# Patient Record
Sex: Male | Born: 1990 | Race: Black or African American | Hispanic: No | Marital: Single | Smoking: Current every day smoker
Health system: Southern US, Community
[De-identification: ages and names within clinical notes are randomized; demographics above are authoritative.]

## PROBLEM LIST (undated history)

## (undated) DIAGNOSIS — J45909 Unspecified asthma, uncomplicated: Secondary | ICD-10-CM

---

## 2016-02-08 ENCOUNTER — Encounter (HOSPITAL_COMMUNITY): Payer: Self-pay

## 2016-02-08 ENCOUNTER — Emergency Department (HOSPITAL_COMMUNITY)
Admission: EM | Admit: 2016-02-08 | Discharge: 2016-02-08 | Disposition: A | Discharge status: Home / Self Care | Payer: Self-pay | Attending: Emergency Medicine | Admitting: Emergency Medicine

## 2016-02-08 DIAGNOSIS — Z720 Tobacco use: Secondary | ICD-10-CM

## 2016-02-08 DIAGNOSIS — F17200 Nicotine dependence, unspecified, uncomplicated: Secondary | ICD-10-CM | POA: Insufficient documentation

## 2016-02-08 DIAGNOSIS — J4521 Mild intermittent asthma with (acute) exacerbation: Secondary | ICD-10-CM | POA: Insufficient documentation

## 2016-02-08 HISTORY — DX: Unspecified asthma, uncomplicated: J45.909

## 2016-02-08 MED ORDER — AEROCHAMBER PLUS FLO-VU MEDIUM MISC
1.0000 | Freq: Once | Status: AC
  Administered 2016-02-08: 1
  Filled 2016-02-08: qty 1

## 2016-02-08 MED ORDER — AEROCHAMBER PLUS W/MASK MISC
Status: AC
  Filled 2016-02-08: qty 1

## 2016-02-08 MED ORDER — PREDNISONE 10 MG (21) PO TBPK: 10.0000 mg | ORAL_TABLET | Freq: Every day | ORAL | 0 refills | Status: DC

## 2016-02-08 MED ORDER — ALBUTEROL SULFATE HFA 108 (90 BASE) MCG/ACT IN AERS
1.0000 | INHALATION_SPRAY | RESPIRATORY_TRACT | Status: DC | PRN
  Administered 2016-02-08: 1 via RESPIRATORY_TRACT
  Filled 2016-02-08: qty 6.7

## 2016-02-08 MED ORDER — ALBUTEROL SULFATE HFA 108 (90 BASE) MCG/ACT IN AERS: 2.0000 | INHALATION_SPRAY | RESPIRATORY_TRACT | 0 refills | Status: DC | PRN

## 2016-02-08 NOTE — ED Triage Notes (Signed)
Pt presents with 3-4 day  H/o shortness of breath, wheezing and productive cough.  Pt reports he is out of his inhaler, reports phlegm is yellow; reports subjective fevers at home.

## 2016-02-08 NOTE — Discharge Instructions (Signed)
Stop smoking

## 2016-02-08 NOTE — ED Notes (Signed)
Pt states he doesn't want a breathing treatment at this time.

## 2016-02-08 NOTE — ED Notes (Signed)
Pt is in stable condition upon d/c and ambulates from ED. 

## 2016-02-08 NOTE — ED Provider Notes (Signed)
[facility]EMER[facility]Y DEPT Provider Note   CSN: [id number] Arrival date & time: 02/08/16  1050     History   Chief Complaint Chief Complaint  Patient presents with  . Shortness of Breath    HPI Raymond [name] is a 25 y.o. male.  Pt presents to the ED with an asthma exacerbation.  He said that he is out of his inhaler.  The pt said that he had congestion and phlegm.  He did not want a cxr.  He does not want a breathing treatment.  He just wants an inhaler.      Past Medical History:  Diagnosis Date  . Asthma     There are no active problems to display for this patient.   History reviewed. No pertinent surgical history.     Home Medications    Prior to Admission medications   Medication Sig Start Date End Date Taking? Authorizing Provider  albuterol (PROVENTIL HFA;VENTOLIN HFA) 108 (90 Base) MCG/ACT inhaler Inhale 2 puffs into the lungs every 4 (four) hours as needed for wheezing or shortness of breath. 02/08/16   Jacalyn LefevreJulie Denecia Brunette, MD  predniSONE (STERAPRED UNI-PAK 21 TAB) 10 MG (21) TBPK tablet Take 1 tablet (10 mg total) by mouth daily. Take 6 tabs by mouth daily  for 2 days, then 5 tabs for 2 days, then 4 tabs for 2 days, then 3 tabs for 2 days, 2 tabs for 2 days, then 1 tab by mouth daily for 2 days 02/08/16   Jacalyn LefevreJulie Vallen Calabrese, MD    Family History History reviewed. No pertinent family history.  Social History Social History  Substance Use Topics  . Smoking status: Current Every Day Smoker    Packs/day: 0.50  . Smokeless tobacco: Never Used  . Alcohol use No     Allergies   Sulfa antibiotics   Review of Systems Review of Systems  Respiratory: Positive for cough, shortness of breath and wheezing.   All other systems reviewed and are negative.    Physical Exam Updated Vital Signs BP 142/81 (BP Location: Left Arm)   Pulse 69   Temp 98.7 F (37.1 C) (Oral)   Resp 18   Ht 5\' 10"  (1.778 m)   Wt 160 lb (72.6 kg)   SpO2 98%   BMI 22.96 kg/m    Physical Exam  Constitutional: He appears well-developed and well-nourished.  HENT:  Head: Normocephalic and atraumatic.  Right Ear: External ear normal.  Left Ear: External ear normal.  Nose: Nose normal.  Mouth/Throat: Oropharynx is clear and moist.  Eyes: Conjunctivae and EOM are normal. Pupils are equal, round, and reactive to light.  Neck: Normal range of motion. Neck supple.  Cardiovascular: Normal rate, regular rhythm, normal heart sounds and intact distal pulses.   Pulmonary/Chest: He has wheezes.  Abdominal: Soft. Bowel sounds are normal.  Musculoskeletal: Normal range of motion.  Neurological: He is alert.  Skin: Skin is warm.  Psychiatric: He has a normal mood and affect. His behavior is normal. Judgment and thought content normal.  Nursing note and vitals reviewed.    ED Treatments / Results  Labs (all labs ordered are listed, but only abnormal results are displayed) Labs Reviewed - No data to display  EKG  EKG Interpretation None       Radiology No results found.  Procedures Procedures (including critical care time)  Medications Ordered in ED Medications  albuterol (PROVENTIL HFA;VENTOLIN HFA) 108 (90 Base) MCG/ACT inhaler 1-2 puff (1 puff Inhalation Given 02/08/16 1223)  aerochamber plus  with mask device (not administered)  AEROCHAMBER PLUS FLO-VU MEDIUM MISC 1 each (1 each Other Given 02/08/16 1223)     Initial Impression / Assessment and Plan / ED Course  I have reviewed the triage vital signs and the nursing notes.  Pertinent labs & imaging results that were available during my care of the patient were reviewed by me and considered in my medical decision making (see chart for details).  Clinical Course    I told pt not to smoke cigarettes.  He does not want a neb and cxr.  Pt given an inhaler and a spacer.  He knows to return if worse.  Final Clinical Impressions(s) / ED Diagnoses   Final diagnoses:  Mild intermittent asthma with  exacerbation  Tobacco abuse    New Prescriptions Discharge Medication List as of 02/08/2016 12:17 PM    START taking these medications   Details  albuterol (PROVENTIL HFA;VENTOLIN HFA) 108 (90 Base) MCG/ACT inhaler Inhale 2 puffs into the lungs every 4 (four) hours as needed for wheezing or shortness of breath., Starting Mon 02/08/2016, Print    predniSONE (STERAPRED UNI-PAK 21 TAB) 10 MG (21) TBPK tablet Take 1 tablet (10 mg total) by mouth daily. Take 6 tabs by mouth daily  for 2 days, then 5 tabs for 2 days, then 4 tabs for 2 days, then 3 tabs for 2 days, 2 tabs for 2 days, then 1 tab by mouth daily for 2 days, Starting Mon 02/08/2016, Print         Jacalyn LefevreJulie Viyaan Champine, MD 02/08/16 1241

## 2016-02-08 NOTE — ED Notes (Signed)
Pt declined to have any imaging.

## 2016-05-12 ENCOUNTER — Encounter (HOSPITAL_COMMUNITY): Payer: Self-pay | Admitting: Emergency Medicine

## 2016-05-12 ENCOUNTER — Emergency Department (HOSPITAL_COMMUNITY)
Admission: EM | Admit: 2016-05-12 | Discharge: 2016-05-12 | Disposition: A | Discharge status: Home / Self Care | Payer: Self-pay | Attending: Emergency Medicine | Admitting: Emergency Medicine

## 2016-05-12 DIAGNOSIS — J45909 Unspecified asthma, uncomplicated: Secondary | ICD-10-CM | POA: Insufficient documentation

## 2016-05-12 DIAGNOSIS — W228XXA Striking against or struck by other objects, initial encounter: Secondary | ICD-10-CM | POA: Insufficient documentation

## 2016-05-12 DIAGNOSIS — F17200 Nicotine dependence, unspecified, uncomplicated: Secondary | ICD-10-CM | POA: Insufficient documentation

## 2016-05-12 DIAGNOSIS — Y929 Unspecified place or not applicable: Secondary | ICD-10-CM | POA: Insufficient documentation

## 2016-05-12 DIAGNOSIS — Y939 Activity, unspecified: Secondary | ICD-10-CM | POA: Insufficient documentation

## 2016-05-12 DIAGNOSIS — S060X0A Concussion without loss of consciousness, initial encounter: Secondary | ICD-10-CM | POA: Insufficient documentation

## 2016-05-12 DIAGNOSIS — Y999 Unspecified external cause status: Secondary | ICD-10-CM | POA: Insufficient documentation

## 2016-05-12 NOTE — ED Triage Notes (Signed)
States bumped his head at work  On Tuesday , denies loc was fine yesterday but today woke up and vomited x 2 and and he has a h/a

## 2016-05-12 NOTE — ED Provider Notes (Signed)
[facility]PT Provider Note   CSN: [id number] Arrival date & time: 05/12/16  1208  By signing my name below, I, Freida Busman, attest that this documentation has been prepared under the direction and in the presence of Derwood Kaplan, MD . Electronically Signed: Freida Busman, Scribe. 05/12/2016. 8:46 PM.  History   Chief Complaint Chief Complaint  Patient presents with  . Head Injury   The history is provided by the patient. No language interpreter was used.   HPI Comments:  Raymond [name] is a 26 y.o. male who presents to the Emergency Department 2 days s/p head injury complaining of a pounding frontal HA that he woke up with this AM. He reports 2 episodes of associated vomiting today; states pain has almost resolved at this time. His pain at time of onset was a 7/10 and is now a 1/10.  Pt states he struck his forehead on a steel bar and had immediate pain at the time but the pain resolved until today. He denies LOC, seizure, dizziness/lightheadedness, and numbness/weakness. No treatements tied PTA.    Past Medical History:  Diagnosis Date  . Asthma     There are no active problems to display for this patient.   History reviewed. No pertinent surgical history.     Home Medications    Prior to Admission medications   Medication Sig Start Date End Date Taking? Authorizing Provider  albuterol (PROVENTIL HFA;VENTOLIN HFA) 108 (90 Base) MCG/ACT inhaler Inhale 2 puffs into the lungs every 4 (four) hours as needed for wheezing or shortness of breath. 02/08/16   Jacalyn Lefevre, MD  predniSONE (STERAPRED UNI-PAK 21 TAB) 10 MG (21) TBPK tablet Take 1 tablet (10 mg total) by mouth daily. Take 6 tabs by mouth daily  for 2 days, then 5 tabs for 2 days, then 4 tabs for 2 days, then 3 tabs for 2 days, 2 tabs for 2 days, then 1 tab by mouth daily for 2 days 02/08/16   Jacalyn Lefevre, MD    Family History No family history on file.  Social History Social History  Substance  Use Topics  . Smoking status: Current Every Day Smoker    Packs/day: 0.50  . Smokeless tobacco: Never Used  . Alcohol use No     Allergies   Sulfa antibiotics   Review of Systems Review of Systems 10 systems reviewed and all are negative for acute change except as noted in the HPI.  Physical Exam Updated Vital Signs BP 118/64 (BP Location: Right Arm)   Pulse (!) 52   Temp 98.6 F (37 C) (Oral)   Resp 16   SpO2 100%   Physical Exam  Constitutional: He is oriented to person, place, and time. He appears well-developed and well-nourished. No distress.  HENT:  Head: Normocephalic.  Eyes: Conjunctivae are normal.  pupils are 2mm equal and reatcive to light  No nystagmus   Neck:  No nuchal rigidity  Cardiovascular: Normal rate and regular rhythm.   Pulmonary/Chest: Effort normal and breath sounds normal. No respiratory distress.  Abdominal: He exhibits no distension.  Musculoskeletal:  Upper extremity sensation and stregth equal   Neurological: He is alert and oriented to person, place, and time. No cranial nerve deficit.  Cerebellar exam is nml  CN 2-12 intact  Skin: Skin is warm and dry.  Psychiatric: He has a normal mood and affect.  Nursing note and vitals reviewed.    ED Treatments / Results  DIAGNOSTIC STUDIES:  Oxygen Saturation is 100% on  RA, normal by my interpretation.    COORDINATION OF CARE:  1:28 PM Discussed treatment plan with pt at bedside and pt agreed to plan.  Labs (all labs ordered are listed, but only abnormal results are displayed) Labs Reviewed - No data to display  EKG  EKG Interpretation None       Radiology No results found.  Procedures Procedures (including critical care time)  Medications Ordered in ED Medications - No data to display   Initial Impression / Assessment and Plan / ED Course  I have reviewed the triage vital signs and the nursing notes.  Pertinent labs & imaging results that were available during my  care of the patient were reviewed by me and considered in my medical decision making (see chart for details).     I personally performed the services described in this documentation, which was scribed in my presence. The recorded information has been reviewed and is accurate.   Pt comes in with headaches. Had blunt trauma 2 days ago, headache resolved for > 36 hours, and returned this AM. Pain is moderate / medium. Pt had emesis. Pt has no seizures, loss of consciousness or new visual complains, weakness, numbness, dizziness or gait instability.  I suspect that he has concussion related headache and not a brain bleed.  Strict ER return precautions have been discussed, and patient is agreeing with the plan and is comfortable with the workup done and the recommendations from the ER.   Final Clinical Impressions(s) / ED Diagnoses   Final diagnoses:  Concussion without loss of consciousness, initial encounter    New Prescriptions Discharge Medication List as of 05/12/2016  1:36 PM         Derwood KaplanAnkit Shanica Castellanos, MD 05/12/16 2048

## 2016-05-12 NOTE — Discharge Instructions (Signed)
Please return to the ER if the headache gets severe and in not improving, you have associated new one sided numbness, tingling, weakness or confusion, seizures, poor balance or poor vision. ° ° ° ° °
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
## 2016-06-17 ENCOUNTER — Inpatient Hospital Stay (HOSPITAL_COMMUNITY)
Admission: AD | Admit: 2016-06-17 | Discharge: 2016-06-21 | DRG: 885 | Disposition: A | Discharge status: Home / Self Care | Payer: Federal, State, Local not specified - Other | Attending: Psychiatry | Admitting: Psychiatry

## 2016-06-17 ENCOUNTER — Emergency Department (HOSPITAL_COMMUNITY)
Admission: EM | Admit: 2016-06-17 | Discharge: 2016-06-17 | Disposition: A | Discharge status: Other Acute Inpatient Hospital | Payer: Self-pay | Attending: Emergency Medicine | Admitting: Emergency Medicine

## 2016-06-17 ENCOUNTER — Emergency Department (HOSPITAL_COMMUNITY): Payer: Self-pay

## 2016-06-17 ENCOUNTER — Encounter (HOSPITAL_COMMUNITY): Payer: Self-pay | Admitting: *Deleted

## 2016-06-17 DIAGNOSIS — F419 Anxiety disorder, unspecified: Secondary | ICD-10-CM | POA: Diagnosis present

## 2016-06-17 DIAGNOSIS — J45909 Unspecified asthma, uncomplicated: Secondary | ICD-10-CM | POA: Diagnosis present

## 2016-06-17 DIAGNOSIS — X780XXA Intentional self-harm by sharp glass, initial encounter: Secondary | ICD-10-CM | POA: Diagnosis present

## 2016-06-17 DIAGNOSIS — Z882 Allergy status to sulfonamides status: Secondary | ICD-10-CM

## 2016-06-17 DIAGNOSIS — Z79899 Other long term (current) drug therapy: Secondary | ICD-10-CM | POA: Diagnosis not present

## 2016-06-17 DIAGNOSIS — F17200 Nicotine dependence, unspecified, uncomplicated: Secondary | ICD-10-CM | POA: Insufficient documentation

## 2016-06-17 DIAGNOSIS — F332 Major depressive disorder, recurrent severe without psychotic features: Principal | ICD-10-CM | POA: Diagnosis present

## 2016-06-17 DIAGNOSIS — S51811A Laceration without foreign body of right forearm, initial encounter: Secondary | ICD-10-CM | POA: Insufficient documentation

## 2016-06-17 DIAGNOSIS — F1290 Cannabis use, unspecified, uncomplicated: Secondary | ICD-10-CM | POA: Diagnosis not present

## 2016-06-17 DIAGNOSIS — S61512A Laceration without foreign body of left wrist, initial encounter: Secondary | ICD-10-CM | POA: Diagnosis present

## 2016-06-17 DIAGNOSIS — Y999 Unspecified external cause status: Secondary | ICD-10-CM | POA: Insufficient documentation

## 2016-06-17 DIAGNOSIS — W540XXA Bitten by dog, initial encounter: Secondary | ICD-10-CM

## 2016-06-17 DIAGNOSIS — F17210 Nicotine dependence, cigarettes, uncomplicated: Secondary | ICD-10-CM | POA: Diagnosis present

## 2016-06-17 DIAGNOSIS — Y929 Unspecified place or not applicable: Secondary | ICD-10-CM | POA: Insufficient documentation

## 2016-06-17 DIAGNOSIS — S0121XA Laceration without foreign body of nose, initial encounter: Secondary | ICD-10-CM | POA: Insufficient documentation

## 2016-06-17 DIAGNOSIS — Z23 Encounter for immunization: Secondary | ICD-10-CM | POA: Insufficient documentation

## 2016-06-17 DIAGNOSIS — Y939 Activity, unspecified: Secondary | ICD-10-CM | POA: Insufficient documentation

## 2016-06-17 DIAGNOSIS — X788XXA Intentional self-harm by other sharp object, initial encounter: Secondary | ICD-10-CM | POA: Insufficient documentation

## 2016-06-17 DIAGNOSIS — Z9189 Other specified personal risk factors, not elsewhere classified: Secondary | ICD-10-CM

## 2016-06-17 LAB — CBC WITH DIFFERENTIAL/PLATELET
BASOS ABS: 0 10*3/uL (ref 0.0–0.1)
BASOS PCT: 0 %
EOS ABS: 0.1 10*3/uL (ref 0.0–0.7)
EOS PCT: 1 %
HCT: 39.7 % (ref 39.0–52.0)
Hemoglobin: 13.7 g/dL (ref 13.0–17.0)
Lymphocytes Relative: 14 %
Lymphs Abs: 2.1 10*3/uL (ref 0.7–4.0)
MCH: 31.6 pg (ref 26.0–34.0)
MCHC: 34.5 g/dL (ref 30.0–36.0)
MCV: 91.7 fL (ref 78.0–100.0)
Monocytes Absolute: 0.6 10*3/uL (ref 0.1–1.0)
Monocytes Relative: 4 %
Neutro Abs: 11.8 10*3/uL — ABNORMAL HIGH (ref 1.7–7.7)
Neutrophils Relative %: 81 %
Platelets: 279 10*3/uL (ref 150–400)
RBC: 4.33 MIL/uL (ref 4.22–5.81)
RDW: 11.6 % (ref 11.5–15.5)
WBC: 14.6 10*3/uL — ABNORMAL HIGH (ref 4.0–10.5)

## 2016-06-17 LAB — RAPID URINE DRUG SCREEN, HOSP PERFORMED
Amphetamines: NOT DETECTED
BARBITURATES: NOT DETECTED
Benzodiazepines: NOT DETECTED
COCAINE: NOT DETECTED
Opiates: NOT DETECTED
TETRAHYDROCANNABINOL: POSITIVE — AB

## 2016-06-17 LAB — COMPREHENSIVE METABOLIC PANEL
ALT: 30 U/L (ref 17–63)
AST: 27 U/L (ref 15–41)
Albumin: 4.6 g/dL (ref 3.5–5.0)
Alkaline Phosphatase: 52 U/L (ref 38–126)
Anion gap: 11 (ref 5–15)
BUN: 13 mg/dL (ref 6–20)
CHLORIDE: 105 mmol/L (ref 101–111)
CO2: 24 mmol/L (ref 22–32)
CREATININE: 1.01 mg/dL (ref 0.61–1.24)
Calcium: 9.6 mg/dL (ref 8.9–10.3)
GFR calc Af Amer: >60 mL/min (ref >60)
GFR calc non Af Amer: >60 mL/min (ref >60)
Glucose, Bld: 85 mg/dL (ref 65–99)
Potassium: 3.4 mmol/L — ABNORMAL LOW (ref 3.5–5.1)
SODIUM: 140 mmol/L (ref 135–145)
Total Bilirubin: 0.7 mg/dL (ref 0.3–1.2)
Total Protein: 7.9 g/dL (ref 6.5–8.1)

## 2016-06-17 LAB — ETHANOL: Alcohol, Ethyl (B): 12 mg/dL — ABNORMAL HIGH (ref <5)

## 2016-06-17 MED ORDER — MAGNESIUM HYDROXIDE 400 MG/5ML PO SUSP: 30.0000 mL | Freq: Every day | ORAL | Status: DC | PRN

## 2016-06-17 MED ORDER — TETANUS-DIPHTH-ACELL PERTUSSIS 5-2.5-18.5 LF-MCG/0.5 IM SUSP
0.5000 mL | Freq: Once | INTRAMUSCULAR | Status: AC
  Administered 2016-06-17: 0.5 mL via INTRAMUSCULAR
  Filled 2016-06-17: qty 0.5

## 2016-06-17 MED ORDER — ENSURE ENLIVE PO LIQD
237.0000 mL | Freq: Two times a day (BID) | ORAL | Status: DC
  Administered 2016-06-18 – 2016-06-21 (×4): 237 mL via ORAL

## 2016-06-17 MED ORDER — POTASSIUM CHLORIDE CRYS ER 20 MEQ PO TBCR
40.0000 meq | EXTENDED_RELEASE_TABLET | Freq: Once | ORAL | Status: AC
  Administered 2016-06-17: 40 meq via ORAL
  Filled 2016-06-17: qty 2

## 2016-06-17 MED ORDER — ACETAMINOPHEN 325 MG PO TABS
650.0000 mg | ORAL_TABLET | Freq: Four times a day (QID) | ORAL | Status: DC | PRN
  Administered 2016-06-18: 650 mg via ORAL
  Filled 2016-06-17: qty 2

## 2016-06-17 MED ORDER — ONDANSETRON HCL 4 MG PO TABS: 4.0000 mg | ORAL_TABLET | Freq: Three times a day (TID) | ORAL | Status: DC | PRN

## 2016-06-17 MED ORDER — ALUM & MAG HYDROXIDE-SIMETH 200-200-20 MG/5ML PO SUSP: 30.0000 mL | ORAL | Status: DC | PRN

## 2016-06-17 MED ORDER — TRAZODONE HCL 50 MG PO TABS
50.0000 mg | ORAL_TABLET | Freq: Every evening | ORAL | Status: DC | PRN
  Administered 2016-06-17 – 2016-06-19 (×5): 50 mg via ORAL
  Filled 2016-06-17 (×10): qty 1

## 2016-06-17 MED ORDER — IBUPROFEN 200 MG PO TABS: 600.0000 mg | ORAL_TABLET | Freq: Three times a day (TID) | ORAL | Status: DC | PRN

## 2016-06-17 MED ORDER — HYDROXYZINE HCL 25 MG PO TABS
25.0000 mg | ORAL_TABLET | Freq: Three times a day (TID) | ORAL | Status: DC | PRN
  Administered 2016-06-17 – 2016-06-20 (×4): 25 mg via ORAL
  Filled 2016-06-17 (×4): qty 1
  Filled 2016-06-17: qty 10

## 2016-06-17 MED ORDER — ACETAMINOPHEN 325 MG PO TABS: 650.0000 mg | ORAL_TABLET | ORAL | Status: DC | PRN

## 2016-06-17 MED ORDER — AMOXICILLIN-POT CLAVULANATE 875-125 MG PO TABS
1.0000 | ORAL_TABLET | Freq: Once | ORAL | Status: AC
  Administered 2016-06-17: 1 via ORAL
  Filled 2016-06-17: qty 1

## 2016-06-17 MED ORDER — AMOXICILLIN-POT CLAVULANATE 875-125 MG PO TABS: 1.0000 | ORAL_TABLET | Freq: Two times a day (BID) | ORAL | Status: DC

## 2016-06-17 MED ORDER — NICOTINE 21 MG/24HR TD PT24
21.0000 mg | MEDICATED_PATCH | Freq: Every day | TRANSDERMAL | Status: DC
  Administered 2016-06-17 – 2016-06-21 (×5): 21 mg via TRANSDERMAL
  Filled 2016-06-17 (×7): qty 1

## 2016-06-17 MED ORDER — LIDOCAINE HCL (PF) 1 % IJ SOLN
5.0000 mL | Freq: Once | INTRAMUSCULAR | Status: AC
  Administered 2016-06-17: 5 mL
  Filled 2016-06-17: qty 30
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
## 2016-06-17 NOTE — ED Notes (Signed)
[employer] on unit to transport [facility] per MD order. Pt signed for personal property and property given to [employer] for transfer. Pt ambulatory off unit.

## 2016-06-17 NOTE — ED Notes (Addendum)
UNABLE TO COLLECT LABS AT THIS TIME PT IN XRAY

## 2016-06-17 NOTE — ED Notes (Signed)
Bed: [facility] Expected date:  Expected time:  Means of arrival:  Comments: 
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
## 2016-06-17 NOTE — ED Notes (Signed)
Pt refused to change into scrubs. 

## 2016-06-17 NOTE — ED Notes (Signed)
Bed: WA26 Expected date:  Expected time:  Means of arrival:  Comments: 
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
## 2016-06-17 NOTE — ED Notes (Addendum)
Patient and belongings wanded by security. Patient changed into purple scrubs and yellow socks. EDP at bedside.
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
## 2016-06-17 NOTE — Tx Team (Addendum)
Initial Treatment Plan 06/17/2016 8:20 PM Raymond FlakeNathaniel [name] ZOX:[id number]    PATIENT STRESSORS: Loss of breakup with girlfriend Traumatic event   PATIENT STRENGTHS: Average or above average intelligence General fund of knowledge Physical Health Work skills   PATIENT IDENTIFIED PROBLEMS: Breakup with girlfriend  "I have never felt like this before, I cannot stop crying"                   DISCHARGE CRITERIA:  Ability to meet basic life and health needs Adequate post-discharge living arrangements Improved stabilization in mood, thinking, and/or behavior Medical problems require only outpatient monitoring Motivation to continue treatment in a less acute level of care Need for constant or close observation no longer present Reduction of life-threatening or endangering symptoms to within safe limits Safe-care adequate arrangements made Verbal commitment to aftercare and medication compliance  PRELIMINARY DISCHARGE PLAN: Outpatient therapy Placement in alternative living arrangements  PATIENT/FAMILY INVOLVEMENT: This treatment plan has been presented to and reviewed with the patient, Raymond [name], and/or family member, .  The patient and family have been given the opportunity to ask questions and make suggestions.  Beatrix ShipperWright, Jan Martin, RN 06/17/2016, 8:20 PM

## 2016-06-18 DIAGNOSIS — F332 Major depressive disorder, recurrent severe without psychotic features: Principal | ICD-10-CM

## 2016-06-18 DIAGNOSIS — Z79899 Other long term (current) drug therapy: Secondary | ICD-10-CM

## 2016-06-18 LAB — TSH: TSH: 0.851 u[IU]/mL (ref 0.350–4.500)

## 2016-06-18 LAB — LIPID PANEL
CHOLESTEROL: 108 mg/dL (ref 0–200)
HDL: 29 mg/dL — AB (ref >40)
LDL CALC: 61 mg/dL (ref 0–99)
Total CHOL/HDL Ratio: 3.7 RATIO
Triglycerides: 92 mg/dL (ref <150)
VLDL: 18 mg/dL (ref 0–40)

## 2016-06-18 MED ORDER — SERTRALINE HCL 25 MG PO TABS
25.0000 mg | ORAL_TABLET | Freq: Every day | ORAL | Status: DC
Start: 2016-06-18 — End: 2016-06-20
  Administered 2016-06-19: 25 mg via ORAL
  Filled 2016-06-18 (×4): qty 1

## 2016-06-18 NOTE — Progress Notes (Signed)
Pt attend group. Pt said his day 1. Pt appears to be very angry. Pt said his goal to get out here. Pt said he needs to continue with his life. Pt said being here will not help him continue will his life.
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
## 2016-06-18 NOTE — [facility] Group Notes (Addendum)
Identifying Needs   Date:  06/18/2016  Time:  1300  Type of Therapy:  Nurse Education  /  Identifying Needs :  The group focuses on teaching patients how to identify their needs and how to identify the skills needed to get them met.  Participation Level:  Good   Participation Quality:  Good  Affect:  Flat   Cognitive:  Intact   Insight:  Intact  Engagement in Group:  Engaged   Modes of Intervention:  Education  Summary of Progress/Problems:  Lauralyn Primes 06/18/2016, 3:26 PM

## 2016-06-18 NOTE — [facility] Group Notes (Addendum)
[facility] Group Notes:  (Nursing/MHT/Case Management/Adjunct)  Date:  06/18/2016  Time:  4:26 PM  Type of Therapy:  Nurse Education  Participation Level:  Active  Participation Quality:  Appropriate  Affect:  Appropriate  Cognitive:  Appropriate  Insight:  Appropriate  Engagement in Group:  Engaged  Modes of Intervention:  Discussion and Education  Summary of Progress/Problems:  Education provided on positive coping skills. Patient was able to participate during group in an insightful and supportive way.    Almira Barenny G Teena Mangus 06/18/2016, 4:26 PM

## 2016-06-18 NOTE — Progress Notes (Signed)
Raymond [name] has adjusted nicely  to being hospitalized. HE completed his daily assessment and on this he wrote he denied experiencing SI today and he rated his depression, hopelessness and anxiety " 2/2/1", respectively.  HE is seen sitting in the dayroom.Marland Kitchen.watching TV and interacting with his peers this afternoon. He approaches this Clinical research associatewriter this evening, introduces his father and mother to this Clinical research associatewriter and asks Clinical research associatewriter to retrieve  his black cell phone in his locker, per his request , and give to his father. This is done and documented on the patient belonging sheet.  This evening he has been seen in the dayroom...coloring and laughing with his peers and says " Im doing my homework...just like you said...". R Safety is in place and poc,cont.

## 2016-06-18 NOTE — Progress Notes (Signed)
Patient ID: Raymond [name], male   DOB: 1990/09/18, 26 y.o.   MRN: [id number]  D: " I need something before I cry". Patient report feeling anxious. Pt minimizes the reason he is here. Pt denies this was a suicide attempt but claimed his dog attacked him.  Pt denies SI/HI/AVH and pain. Cooperative with assessment.    A: Medications administered as prescribed. Support and encouragement provided as needed.   R: Patient remains safe and complaint with medications.

## 2016-06-18 NOTE — [facility] Group Notes (Signed)
Adult Therapy Group Note (Clinical Social Work)  Date:  06/18/2016  Time:  10:00-11:00AM  Group Topic/Focus: Unhealthy vs Healthy Coping Techniques  The focus of this group was to discuss unhealthy coping techniques currently used by patients, as well as healthy coping techniques they think they would like to learn.  The group discussed that they heard a lot of commonalities, especially the feelings of not being understood and of being ashamed.  They were supportive of each other and generated further discussion, were encouraged to continue talking together after group ended.   Additional Comments:  The patient expressed that when he is faced with problems he often using the unhealthy coping techniques of isolating and thinking negative thoughts.  He would like to learn to meditate and return to doing some of his previous hobbies such as riding a long board and playing basketball.  He was very focused during group on how people in our lives tend to let us down because "they don't care," and rather than this being taken in a negative way by the group, in general there was a consensus.  CSW then introduced the idea of assuming everyone wants to harm us because some people have harmed us.  He was able to distinguish between the two.  He also provided support to another patient specifically on his issue of grief.  Participation Level:  Active  Participation Quality:  Attentive and Sharing and Supportive  Affect:  Anxious and Blunted  Cognitive:  Oriented  Insight: Improving  Engagement in Group:  Developing/Improving  Modes of Intervention:  Discussion and Support  Raymond MantleMareida Grossman-Orr, LCSW 06/18/2016   11:01 AM
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
## 2016-06-19 DIAGNOSIS — R45851 Suicidal ideations: Secondary | ICD-10-CM

## 2016-06-19 LAB — HEMOGLOBIN A1C
Hgb A1c MFr Bld: 4.9 % (ref 4.8–5.6)
MEAN PLASMA GLUCOSE: 94 mg/dL

## 2016-06-19 NOTE — [facility] Group Notes (Signed)
[facility] Group Notes:  (Nursing/MHT/Case Management/Adjunct)  Date:  06/19/2016  Time:  10:26 AM  Type of Therapy:  Nurse Education  Participation Level:  Active  Participation Quality:  Appropriate  Affect:  Appropriate  Cognitive:  Appropriate  Insight:  Appropriate  Engagement in Group:  Engaged  Modes of Intervention:  Discussion and Education  Summary of Progress/Problems:  Educational discussion about anxiety and positive coping skills to manage it. Patient was insightful and attentive throughout group.   Almira Barenny G Shantanu Strauch 06/19/2016, 10:26 AM

## 2016-06-19 NOTE — Progress Notes (Signed)
Patient ID: Raymond [name], male   DOB: 05/16/1990, [age] y.o.   MRN: [id number]  D: Patient report decreased feeling of anxiety. Pt reports he is happy his family is supportive and have a place to go back discharge. Pt denies SI/HI/AVH and pain. Cooperative with assessment.    A: Medications administered as prescribed. Support and encouragement provided as needed.   R: Patient remains safe and complaint with medications.

## 2016-06-19 NOTE — [facility] Group Notes (Signed)
[facility] Group Notes:  (Clinical Social Work)   06/19/2016    10:00-11:00AM  Summary of Progress/Problems:   The main focus of today's process group was to   1)  discuss the importance of adding supports  2)  define health supports versus unhealthy supports  3)  identify the patient's current unhealthy supports and plan how to handle them  4)  Identify the patient's current healthy supports and plan what to add.  An emphasis was placed on using counselor, doctor, therapy groups, 12-step groups, and problem-specific support groups to expand supports.    The patient expressed full comprehension of the concepts presented, and agreed that there is a need to add more supports.  The patient stated he has supported his girlfriend, her sister, and sister's son in "every way imaginable" and has also supported his dog.  He stated that the support he needs to succeed in aftercare is a psychiatrist and to become healthier for himself.  Type of Therapy:  Process Group with Motivational Interviewing  Participation Level:  Active  Participation Quality:  Attentive and Sharing  Affect:  Depressed and Flat  Cognitive:  Appropriate  Insight:  Developing/Improving  Engagement in Therapy:  Engaged  Modes of Intervention:   Education, Support and Processing  Ambrose MantleMareida Grossman-Orr, LCSW 06/19/2016    12:31 PM

## 2016-06-19 NOTE — Progress Notes (Signed)
Pt attend group. His day was a 9. Pt said he saw his day for the first time in 5 years but did not want to see his dad in a mental hospital. He need to figure out why he's here. He know he made a mistake that now he wish he can take back. Now he need to figure out how to grow from here and Scientist, physiologicalatty RN help him to understand.

## 2016-06-19 NOTE — Progress Notes (Signed)
NUTRITION ASSESSMENT  Pt identified as at risk on the Malnutrition Screen Tool  INTERVENTION: 1. Supplements: Continue Ensure Enlive po BID, each supplement provides 350 kcal and 20 grams of protein  NUTRITION DIAGNOSIS: Unintentional weight loss related to sub-optimal intake as evidenced by pt report.   Goal: Pt to meet >/= 90% of their estimated nutrition needs.  Monitor:  PO intake  Assessment:  Pt admitted with depression. Per chart review, pt has lost 17 lb since November 2017 (11% wt loss x 4 months, significant for time frame). [facility]. Will continue nutritional supplement order of Ensure.   Height: Ht Readings from Last 1 Encounters:  06/17/16 5\' 10"  (1.778 m)    Weight: Wt Readings from Last 1 Encounters:  06/17/16 143 lb (64.9 kg)    Weight Hx: Wt Readings from Last 10 Encounters:  06/17/16 143 lb (64.9 kg)  02/08/16 160 lb (72.6 kg)    BMI:  Body mass index is 20.52 kg/m. Pt meets criteria for normal based on current BMI.  Estimated Nutritional Needs: Kcal: 25-30 kcal/kg Protein: > 1 gram protein/kg Fluid: 1 ml/kcal  Diet Order: Diet regular Room service appropriate? Yes; Fluid consistency: Thin Pt is also offered choice of unit snacks mid-morning and mid-afternoon.  Pt is eating as desired.   Lab results and medications reviewed.   Tilda FrancoLindsey Mariah Gerstenberger, MS, RD, LDN Pager: [phone] After Hours Pager: [phone]

## 2016-06-19 NOTE — Progress Notes (Signed)
Raymond [name], Raymond [name] Progress Note  06/19/2016 9:07 AM Raymond [name]  MRN:  [id number] Subjective:  Patient reports " I am sad and depressed, but I know I have to get me together before I can help some else."  Objective:Raymond [name] is awake, alert and oriented *3.  Denies suicidal or homicidal ideation during this assessment. Denies auditory or visual hallucination and does not appear to be responding to internal stimuli. Patient is tearful throughout this discussion.  Patient reports he is medication compliant without mediation side effects. Patient reports his depression 8/10. Support, encouragement and reassurance was provided.   Principal Problem: Severe recurrent major depression without psychotic features (HCC) Diagnosis:   Patient Active Problem List   Diagnosis Date Noted  . Severe recurrent major depression without psychotic features (HCC) [F33.2] 06/17/2016   Total Time spent with patient: 30 minutes  Past Psychiatric History:   Past Medical History:  Past Medical History:  Diagnosis Date  . Asthma    History reviewed. No pertinent surgical history. Family History: History reviewed. No pertinent family history. Family Psychiatric  History:  Social History:  History  Alcohol Use No     History  Drug Use  . Types: Marijuana    Social History   Social History  . Marital status: Single    Spouse name: N/A  . Number of children: N/A  . Years of education: N/A   Social History Main Topics  . Smoking status: Current Every Day Smoker    Packs/day: 0.50  . Smokeless tobacco: Never Used  . Alcohol use No  . Drug use: Yes    Types: Marijuana  . Sexual activity: Yes    Birth control/ protection: None   Other Topics Concern  . None   Social History Narrative  . None   Additional Social History:                         Sleep: Fair  Appetite:  Good  Current Medications: Current Facility-Administered Medications  Medication Dose Route Frequency  Provider Last Rate Last Dose  . acetaminophen (TYLENOL) tablet 650 mg  650 mg Oral Q6H PRN Jackelyn Poling, NP   650 mg at 06/18/16 1112  . alum & mag hydroxide-simeth (MAALOX/MYLANTA) 200-200-20 MG/5ML suspension 30 mL  30 mL Oral Q4H PRN Jackelyn Poling, NP      . feeding supplement (ENSURE ENLIVE) (ENSURE ENLIVE) liquid 237 mL  237 mL Oral BID BM Rockey Situ Cobos, [name]   237 mL at 06/18/16 1500  . hydrOXYzine (ATARAX/VISTARIL) tablet 25 mg  25 mg Oral TID PRN Jackelyn Poling, NP   25 mg at 06/18/16 2012  . magnesium hydroxide (MILK OF MAGNESIA) suspension 30 mL  30 mL Oral Daily PRN Jackelyn Poling, NP      . nicotine (NICODERM CQ - dosed in mg/24 hours) patch 21 mg  21 mg Transdermal Daily Rockey Situ Cobos, [name]   21 mg at 06/18/16 0800  . sertraline (ZOLOFT) tablet 25 mg  25 mg Oral Daily Adonis Brook, NP      . traZODone (DESYREL) tablet 50 mg  50 mg Oral QHS,MR X 1 Jackelyn Poling, NP   50 mg at 06/18/16 2125    Lab Results:  Results for orders placed or performed during the hospital encounter of 06/17/16 (from the past 48 hour(s))  TSH     Status: None   Collection Time: 06/18/16  6:08 AM  Result Value Ref  Range   TSH 0.851 0.350 - 4.500 uIU/mL    Comment: Performed by a 3rd Generation assay with a functional sensitivity of <=0.01 uIU/mL. Performed at [facility], [address] [place], [place] [address]   Lipid panel     Status: Abnormal   Collection Time: 06/18/16  6:08 AM  Result Value Ref Range   Cholesterol 108 0 - 200 mg/dL   Triglycerides 92 <478<150 mg/dL   HDL 29 (L) >29>40 mg/dL   Total CHOL/HDL Ratio 3.7 RATIO   VLDL 18 0 - 40 mg/dL   LDL Cholesterol 61 0 - 99 mg/dL    Comment:        Total Cholesterol/HDL:CHD Risk Coronary Heart Disease Risk Table                     Men   Women  1/2 Average Risk   3.4   3.3  Average Risk       5.0   4.4  2 X Average Risk   9.6   7.1  3 X Average Risk  23.4   11.0        Use the calculated Patient Ratio above and the CHD  Risk Table to determine the patient's CHD Risk.        ATP III CLASSIFICATION (LDL):  <100     mg/dL   Optimal  [id number]  mg/dL   Near or Above                    Optimal  130-159  mg/dL   Borderline  [id number]  mg/dL   High  >696>190     mg/dL   Very High Performed at [facility], [address] [place], [place] [address]     Blood Alcohol level:  Lab Results  Component Value Date   ETH 12 (H) 06/17/2016    Metabolic Disorder Labs: No results found for: HGBA1C, MPG No results found for: PROLACTIN Lab Results  Component Value Date   CHOL 108 06/18/2016   TRIG 92 06/18/2016   HDL 29 (L) 06/18/2016   CHOLHDL 3.7 06/18/2016   VLDL 18 06/18/2016   LDLCALC 61 06/18/2016    Physical Findings: AIMS: Facial and Oral Movements Muscles of Facial Expression: None, normal Lips and Perioral Area: None, normal Jaw: None, normal Tongue: None, normal,Extremity Movements Upper (arms, wrists, hands, fingers): None, normal Lower (legs, knees, ankles, toes): None, normal, Trunk Movements Neck, shoulders, hips: None, normal, Overall Severity Severity of abnormal movements (highest score from questions above): None, normal Incapacitation due to abnormal movements: None, normal Patient's awareness of abnormal movements (rate only patient's report): No Awareness, Dental Status Current problems with teeth and/or dentures?: No Does patient usually wear dentures?: No  CIWA:    COWS:     Musculoskeletal: Strength & Muscle Tone: within normal limits Gait & Station: normal Patient leans: N/A  Psychiatric Specialty Exam: Physical Exam  Vitals reviewed. Constitutional: He is oriented to person, place, and time.  Cardiovascular: Normal rate.   Neurological: He is alert and oriented to person, place, and time.  Psychiatric: He has a normal mood and affect. His behavior is normal.    Review of Systems  Psychiatric/Behavioral: Positive for depression and suicidal ideas. The patient is  nervous/anxious.     Blood pressure 123/78, pulse (!) 59, temperature 97.6 F (36.4 C), temperature source Oral, resp. rate 18, height 5\' 10"  (1.778 m), weight 64.9 kg (143 lb).Body  mass index is 20.52 kg/m.  General Appearance: Casual  Eye Contact:  Fair  Speech:  Clear and Coherent  Volume:  Normal  Mood:  Depressed  Affect:  Blunt, Congruent, Depressed and Flat  Thought Process:  Coherent  Orientation:  Full (Time, Place, and Person)  Thought Content:  Hallucinations: None  Suicidal Thoughts:  No  Homicidal Thoughts:  No  Memory:  Immediate;   Fair Recent;   Fair Remote;   Fair  Judgement:  Fair  Insight:  Lacking  Psychomotor Activity:  Restlessness  Concentration:  Concentration: Fair  Recall:  [employer] of Knowledge:  Fair  Language:  Fair  Akathisia:  No  Handed:  Right  AIMS (if indicated):     Assets:  Communication Skills Desire for Improvement Resilience Social Support  ADL's:  Intact  Cognition:  WNL  Sleep:        I agree with current treatment plan on 06/19/2016, Patient seen face-to-face for psychiatric evaluation follow-up, chart reviewed. Reviewed the information documented and agree with the treatment plan.   Treatment Plan Summary: Daily contact with patient to assess and evaluate symptoms and progress in treatment and Medication management   Continue with Zoloft 25 mg  for mood stabilization. Continue with Trazodone 50 mg for insomnia Will continue to monitor vitals ,medication compliance and treatment side effects while patient is here.  CSW will start working on disposition.  Patient to participate in therapeutic milieu   Oneta Rack, NP 06/19/2016, 9:07 AM  Reviewed notes and agree with plan.

## 2016-06-20 DIAGNOSIS — F1290 Cannabis use, unspecified, uncomplicated: Secondary | ICD-10-CM

## 2016-06-20 DIAGNOSIS — F17210 Nicotine dependence, cigarettes, uncomplicated: Secondary | ICD-10-CM

## 2016-06-20 LAB — PROLACTIN: Prolactin: 15.3 ng/mL — ABNORMAL HIGH (ref 4.0–15.2)

## 2016-06-20 MED ORDER — AMOXICILLIN-POT CLAVULANATE 875-125 MG PO TABS
1.0000 | ORAL_TABLET | Freq: Two times a day (BID) | ORAL | Status: DC
  Administered 2016-06-20 – 2016-06-21 (×2): 1 via ORAL
  Filled 2016-06-20 (×7): qty 1

## 2016-06-20 MED ORDER — AMOXICILLIN-POT CLAVULANATE 875-125 MG PO TABS: 1.0000 | ORAL_TABLET | Freq: Two times a day (BID) | ORAL | Status: DC

## 2016-06-20 MED ORDER — MIRTAZAPINE 7.5 MG PO TABS
7.5000 mg | ORAL_TABLET | Freq: Every day | ORAL | Status: DC
  Administered 2016-06-20: 7.5 mg via ORAL
  Filled 2016-06-20: qty 7
  Filled 2016-06-20 (×2): qty 1
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
## 2016-06-20 NOTE — Progress Notes (Signed)
Adult Psychoeducational Group Note  Date:  06/20/2016 Time:  9:45 PM  Group Topic/Focus:  Wrap-Up Group:   The focus of this group is to help patients review their daily goal of treatment and discuss progress on daily workbooks.  Participation Level:  Active  Participation Quality:  Appropriate  Affect:  Appropriate  Cognitive:  Appropriate  Insight: Appropriate  Engagement in Group:  Engaged  Modes of Intervention:  Discussion  Additional Comments:  Patient attended wrap-up group and said that his day was a 10. Something positive, he was excited to see others being discharged today and wishes them best of lucks. His coping skills were watching the clock and deep breathing.   Carlinda Ohlson W Melo Stauber 06/20/2016, 9:45 PM

## 2016-06-20 NOTE — Progress Notes (Signed)
Recreation Therapy Notes  Date: 06/20/16 Time: 0930 Location: 300 Hall Dayroom  Group Topic: Stress Management  Goal Area(s) Addresses:  Patient will verbalize importance of using healthy stress management.  Patient will identify positive emotions associated with healthy stress management.   Intervention: Stress Management  Activity :  [place].  LRT introduced the stress management technique of guided imagery.  LRT read [name] script that allowed patients to take [name] "mental vacation" by envisioning themselves in [name].  Patients were to follow along with the LRT as the script was read to engage in the technique.  Education:  Stress Management, Discharge Planning.   Education Outcome: Acknowledges edcuation/In group clarification offered/Needs additional education  Clinical Observations/Feedback: Pt did not attend group.    Keilah Lemire, LRT/CTRS         Raymond [name] 06/20/2016 11:47 AM 
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
## 2016-06-20 NOTE — [facility] Group Notes (Signed)
[facility] LCSW Group Therapy  06/20/2016 4:03 PM  Type of Therapy:  Group Therapy  Participation Level:  Active  Participation Quality:  Attentive  Affect:  Appropriate  Cognitive:  Alert  Insight:  Improving  Engagement in Therapy:  Improving  Modes of Intervention:  Discussion, Education, Socialization and Support  Summary of Progress/Problems: Balance in life: Patients will discuss the concept of balance and how it looks and feels to be unbalanced. Pt will identify areas in their life that is unbalanced and ways to become more balanced.    Zaira Iacovelli L Blanche Scovell MSW, LCSWA  06/20/2016, 4:03 PM   

## 2016-06-20 NOTE — Progress Notes (Signed)
Patient ID: Raymond [name], male   DOB: Jul 11, 1990, 26 y.o.   MRN: [id number] D: client visible on the unit seen interacting with peers and watching TV in dayroom. Client reports today's goal: "make sure I handle everything I need to so I know I'm making right step forward" client reports of this admission "I feel like I learned a lot, being here helped give me insight and assurance thanks to Ms. Patty" Client reports anxiety "3" of 10. "I had a couple of bad thoughts today but not enough to keep me down" Client has a secure ace wrap to R. Arm, able to move fingers freely, capillary refill <2 seconds. Client denies pain. A: Writer provided emotional support encouraged client to maintain positive thoughts as he reflects on things learned in group. Client encouraged to report any concerns to staff. Medications reviewed, administered as ordered. Staff will monitor q7915min for safety. R: Client is safe on the unit, attended group.

## 2016-06-20 NOTE — Tx Team (Signed)
Interdisciplinary Treatment and Diagnostic Plan Update  06/20/2016 Time of Session: 9:41 AM  Raymond [name] MRN: [id number]  Principal Diagnosis: Severe recurrent major depression without psychotic features [facility])  Secondary Diagnoses: Principal Problem:   Severe recurrent major depression without psychotic features ([place])   Current Medications:  Current Facility-Administered Medications  Medication Dose Route Frequency Provider Last Rate Last Dose  . acetaminophen (TYLENOL) tablet 650 mg  650 mg Oral Q6H PRN Rozetta Nunnery, NP   650 mg at 06/18/16 1112  . alum & mag hydroxide-simeth (MAALOX/MYLANTA) 200-200-20 MG/5ML suspension 30 mL  30 mL Oral Q4H PRN Rozetta Nunnery, NP      . feeding supplement (ENSURE ENLIVE) (ENSURE ENLIVE) liquid 237 mL  237 mL Oral BID BM Myer Peer Cobos, MD   237 mL at 06/19/16 1500  . hydrOXYzine (ATARAX/VISTARIL) tablet 25 mg  25 mg Oral TID PRN Rozetta Nunnery, NP   25 mg at 06/20/16 2951  . magnesium hydroxide (MILK OF MAGNESIA) suspension 30 mL  30 mL Oral Daily PRN Rozetta Nunnery, NP      . nicotine (NICODERM CQ - dosed in mg/24 hours) patch 21 mg  21 mg Transdermal Daily Jenne Campus, MD   21 mg at 06/20/16 0808  . sertraline (ZOLOFT) tablet 25 mg  25 mg Oral Daily Kerrie Buffalo, NP   25 mg at 06/19/16 0943  . traZODone (DESYREL) tablet 50 mg  50 mg Oral QHS,MR X 1 Rozetta Nunnery, NP   50 mg at 06/19/16 2223    PTA Medications: Prescriptions Prior to Admission  Medication Sig Dispense Refill Last Dose  . hydrocortisone cream 1 % Apply 1 application topically 2 (two) times daily as needed for itching.   Past Week at Unknown time    Treatment Modalities: Medication Management, Group therapy, Case management,  1 to 1 session with clinician, Psychoeducation, Recreational therapy.   Physician Treatment Plan for Primary Diagnosis: Severe recurrent major depression without psychotic features ([place]) Long Term Goal(s): Improvement in symptoms so as ready  for discharge  Short Term Goals: Ability to identify changes in lifestyle to reduce recurrence of condition will improve Ability to verbalize feelings will improve Ability to disclose and discuss suicidal ideas Ability to demonstrate self-control will improve Ability to identify and develop effective coping behaviors will improve Ability to maintain clinical measurements within normal limits will improve Compliance with prescribed medications will improve Ability to identify triggers associated with substance abuse/mental health issues will improve Ability to identify changes in lifestyle to reduce recurrence of condition will improve Ability to verbalize feelings will improve Ability to disclose and discuss suicidal ideas Ability to demonstrate self-control will improve Ability to identify and develop effective coping behaviors will improve Ability to maintain clinical measurements within normal limits will improve Compliance with prescribed medications will improve Ability to identify triggers associated with substance abuse/mental health issues will improve  Medication Management: Evaluate patient's response, side effects, and tolerance of medication regimen.  Therapeutic Interventions: 1 to 1 sessions, Unit Group sessions and Medication administration.  Evaluation of Outcomes: Progressing  Physician Treatment Plan for Secondary Diagnosis: Principal Problem:   Severe recurrent major depression without psychotic features ([place])   Long Term Goal(s): Improvement in symptoms so as ready for discharge  Short Term Goals: Ability to identify changes in lifestyle to reduce recurrence of condition will improve Ability to verbalize feelings will improve Ability to disclose and discuss suicidal ideas Ability to demonstrate self-control will improve Ability to identify  and develop effective coping behaviors will improve Ability to maintain clinical measurements within normal limits will  improve Compliance with prescribed medications will improve Ability to identify triggers associated with substance abuse/mental health issues will improve Ability to identify changes in lifestyle to reduce recurrence of condition will improve Ability to verbalize feelings will improve Ability to disclose and discuss suicidal ideas Ability to demonstrate self-control will improve Ability to identify and develop effective coping behaviors will improve Ability to maintain clinical measurements within normal limits will improve Compliance with prescribed medications will improve Ability to identify triggers associated with substance abuse/mental health issues will improve  Medication Management: Evaluate patient's response, side effects, and tolerance of medication regimen.  Therapeutic Interventions: 1 to 1 sessions, Unit Group sessions and Medication administration.  Evaluation of Outcomes: Progressing   RN Treatment Plan for Primary Diagnosis: Severe recurrent major depression without psychotic features ([place]) Long Term Goal(s): Knowledge of disease and therapeutic regimen to maintain health will improve  Short Term Goals: Ability to identify and develop effective coping behaviors will improve and Compliance with prescribed medications will improve  Medication Management: RN will administer medications as ordered by provider, will assess and evaluate patient's response and provide education to patient for prescribed medication. RN will report any adverse and/or side effects to prescribing provider.  Therapeutic Interventions: 1 on 1 counseling sessions, Psychoeducation, Medication administration, Evaluate responses to treatment, Monitor vital signs and CBGs as ordered, Perform/monitor CIWA, COWS, AIMS and Fall Risk screenings as ordered, Perform wound care treatments as ordered.  Evaluation of Outcomes: Progressing   LCSW Treatment Plan for Primary Diagnosis: Severe recurrent major  depression without psychotic features ([place]) Long Term Goal(s): Safe transition to appropriate next level of care at discharge, Engage patient in therapeutic group addressing interpersonal concerns.  Short Term Goals: Engage patient in aftercare planning with referrals and resources  Therapeutic Interventions: Assess for all discharge needs, 1 to 1 time with Social worker, Explore available resources and support systems, Assess for adequacy in community support network, Educate family and significant other(s) on suicide prevention, Complete Psychosocial Assessment, Interpersonal group therapy.  Evaluation of Outcomes: Met  Return home, follow up outpt   Progress in Treatment: Attending groups: Yes Participating in groups: Yes Taking medication as prescribed: Yes Toleration medication: Yes, no side effects reported at this time Family/Significant other contact made: No Patient understands diagnosis: Yes AEB asking for help with depression Discussing patient identified problems/goals with staff: Yes Medical problems stabilized or resolved: Yes Denies suicidal/homicidal ideation: Yes Issues/concerns per patient self-inventory: None Other: N/A  New problem(s) identified: None identified at this time.   New Short Term/Long Term Goal(s): None identified at this time.   Discharge Plan or Barriers:   Reason for Continuation of Hospitalization:  Depression  Medication stabilization   Estimated Length of Stay: 3-5 days  Attendees: Patient: 06/20/2016  9:41 AM  Physician: Neita Garnet, MD 06/20/2016  9:41 AM  Nursing: Darrol Angel RN 06/20/2016  9:41 AM  RN Care Manager: Lars Pinks, RN 06/20/2016  9:41 AM  Social Worker: Ripley Fraise 06/20/2016  9:41 AM  Recreational Therapist: Laretta Bolster  06/20/2016  9:41 AM  Other: Norberto Sorenson 06/20/2016  9:41 AM  Other:  06/20/2016  9:41 AM    Scribe for Treatment Team:  Roque Lias LCSW 06/20/2016 9:41 AM

## 2016-06-20 NOTE — Progress Notes (Signed)
D: Pt rates depression 0. Anxiety 3. Hopelessness 0. Pt denies suicidal thoughts. Pt refuses to take Zoloft. Pt stated "it makes me feel like a Zombie, I feel sluggish and groggy after taking it". MD made aware of pt complaint. Pt stated that he's willing to take any other antidepressant.  Pt denies pain to right forearm. No drainage noted.  A: Medications reviewed with pt. Medications administered as ordered per MD. Verbal support provided. Pt encouraged to attend groups. 15 minute checks performed for safety.  R: Pt receptive to tx. Pt appears to be minimizing.

## 2016-06-21 MED ORDER — MIRTAZAPINE 7.5 MG PO TABS: 7.5000 mg | ORAL_TABLET | Freq: Every day | ORAL | 0 refills | Status: AC

## 2016-06-21 MED ORDER — HYDROXYZINE HCL 25 MG PO TABS: 25.0000 mg | ORAL_TABLET | Freq: Three times a day (TID) | ORAL | 0 refills | Status: AC | PRN

## 2016-06-21 MED ORDER — NICOTINE 21 MG/24HR TD PT24: 21.0000 mg | MEDICATED_PATCH | Freq: Every day | TRANSDERMAL | 0 refills | Status: AC

## 2016-06-21 NOTE — Plan of Care (Signed)
Problem: Self-Concept: Goal: Level of anxiety will decrease Outcome: Progressing Level of anxiety will decrease AEB reporting anxiety level of "3" of 10. "I feel like I learned a lot, helped give me insight/reassurence"

## 2016-06-21 NOTE — Progress Notes (Signed)
Discharge note:  Patient discharged home per MD order.  Patient received all personal belongings from unit and locker.  Reviewed AVS/transition record with patient and he indicated understanding.  Patient received prescriptions of his medications.  Patient denies any thoughts of self harm.  He denies HI and does not appear to be responding to internal stimuli.  Patient had to be prompted several times to leave with staff.  Patient was too involved with another's patient's care and was demanding with staff and telling them how to care for the patient.  patient wanted to come back on the unit stating he "forgot something."  Staff went to get item and could not find the item.  When staff came back out to search room, patient stated, "oh, sorry.  I had it all along."  Patient apologized for his behavior on the unit.  He stated, "it's not your fault.  I don't want you to have a bad taste in your mouth over the way I behaved.  I was concerned about him."  Patient was informed we are used to dealing with different behaviors and sometimes space is needed when a patient is anxious or agitated.  Patient discharged to lobby to wait for his ride.

## 2016-06-21 NOTE — Discharge Summary (Signed)
Physician Discharge Summary Note  Patient:  Raymond [name] Thor is an 26 y.o., male MRN:  [id number] DOB:  Oct 30, 1990 Patient phone:  [phone] (home)  Patient address[place]   [address] [place] [address] [place] [address],  Total Time spent with patient: 30 minutes  Date of Admission:  06/17/2016 Date of Discharge: 06/21/2016  Reason for Admission:  Suicide attempt  Principal Problem: Severe recurrent major depression without psychotic features [facility](HCC) Discharge Diagnoses: Patient Active Problem List   Diagnosis Date Noted  . Severe recurrent major depression without psychotic features (HCC) [F33.2] 06/17/2016    Priority: High    Past Psychiatric History:  See HPI  Past Medical History:  Past Medical History:  Diagnosis Date  . Asthma    History reviewed. No pertinent surgical history. Family History: History reviewed. No pertinent family history. Family Psychiatric  History:  See HPI Social History:  History  Alcohol Use No     History  Drug Use  . Types: Marijuana    Social History   Social History  . Marital status: Single    Spouse name: N/A  . Number of children: N/A  . Years of education: N/A   Social History Main Topics  . Smoking status: Current Every Day Smoker    Packs/day: 0.50  . Smokeless tobacco: Never Used  . Alcohol use No  . Drug use: Yes    Types: Marijuana  . Sexual activity: Yes    Birth control/ protection: None   Other Topics Concern  . None   Social History Narrative  . None    Hospital Course:   Gardiner Barefootathaniel was admitted with MDD.  He slit his right arm that required stitches and it on a soft cast.  [facility].  He forwarded little at the time and was minimizing the gravity of the event.  He was discharged in improved spirits .  However, incident occurred per nursing that he was "too involved with another patient."  Raymond [name] Herbel was admitted for Severe recurrent major depression  without psychotic features (HCC) and crisis management.  Patient was treated with medications with their indications listed below in detail under Medication List.  Medical problems were identified and treated as needed.  Home medications were restarted as appropriate.  Improvement was monitored by observation and Raymond FlakeNathaniel Cantu daily report of symptom reduction.  Emotional and mental status was monitored by daily self inventory reports completed by Raymond FlakeNathaniel Barrie and clinical staff.  Patient reported continued improvement, denied any new concerns.  Patient had been compliant on medications and denied side effects.  Support and encouragement was provided.    Patient encouraged to attend groups to help with recognizing triggers of emotional crises and de-stabilizations.  Patient encouraged to attend group to help identify the positive things in life that would help in dealing with feelings of loss, depression and unhealthy or abusive tendencies.         Raymond [name] Baril was evaluated by the treatment team for stability and plans for continued recovery upon discharge.  Patient was offered further treatment options upon discharge including Residential, Intensive Outpatient and Outpatient treatment. Patient will follow up with agency listed below for medication management and counseling.  Encouraged patient to maintain satisfactory support network and home environment.  Advised to adhere to medication compliance and outpatient treatment follow up.  Prescriptions provided.       Raymond [name] Janvrin motivation was an integral factor for scheduling further treatment.  Employment, transportation, bed availability, health status, family support, and any pending legal issues were also considered during patient's hospital stay.  Upon completion of this admission the patient was both mentally and medically stable for discharge denying suicidal/homicidal ideation, auditory/visual/tactile hallucinations,  delusional thoughts and paranoia.      Physical Findings: AIMS: Facial and Oral Movements Muscles of Facial Expression: None, normal Lips and Perioral Area: None, normal Jaw: None, normal Tongue: None, normal,Extremity Movements Upper (arms, wrists, hands, fingers): None, normal Lower (legs, knees, ankles, toes): None, normal, Trunk Movements Neck, shoulders, hips: None, normal, Overall Severity Severity of abnormal movements (highest score from questions above): None, normal Incapacitation due to abnormal movements: None, normal Patient's awareness of abnormal movements (rate only patient's report): No Awareness, Dental Status Current problems with teeth and/or dentures?: No Does patient usually wear dentures?: No  CIWA:    COWS:     Musculoskeletal: Strength & Muscle Tone: within normal limits Gait & Station: normal Patient leans: N/A  Psychiatric Specialty Exam: Physical Exam  Nursing note and vitals reviewed.   ROS  Blood pressure (!) 147/94, pulse 62, temperature 97.9 F (36.6 C), temperature source Oral, resp. rate 20, height 5\' 10"  (1.778 m), weight 64.9 kg (143 lb).Body mass index is 20.52 kg/m.    Have you used any form of tobacco in the last 30 days? (Cigarettes, Smokeless Tobacco, Cigars, and/or Pipes): Yes  Has this patient used any form of tobacco in the last 30 days? (Cigarettes, Smokeless Tobacco, Cigars, and/or Pipes) Yes, N/A  Blood Alcohol level:  Lab Results  Component Value Date   ETH 12 (H) 06/17/2016    Metabolic Disorder Labs:  Lab Results  Component Value Date   HGBA1C 4.9 06/18/2016   MPG 94 06/18/2016   Lab Results  Component Value Date   PROLACTIN 15.3 (H) 06/18/2016   Lab Results  Component Value Date   CHOL 108 06/18/2016   TRIG 92 06/18/2016   HDL 29 (L) 06/18/2016   CHOLHDL 3.7 06/18/2016   VLDL 18 06/18/2016   LDLCALC 61 06/18/2016    See Psychiatric Specialty Exam and Suicide Risk Assessment completed by Attending  Physician prior to discharge.  Discharge destination:  Home  Is patient on multiple antipsychotic therapies at discharge:  No   Has Patient had three or more failed trials of antipsychotic monotherapy by history:  No  Recommended Plan for Multiple Antipsychotic Therapies: NA   Allergies as of 06/21/2016      Reactions   Sulfa Antibiotics Hives      Medication List    STOP taking these medications   hydrocortisone cream 1 %     TAKE these medications     Indication  hydrOXYzine 25 MG tablet Commonly known as:  ATARAX/VISTARIL Take 1 tablet (25 mg total) by mouth 3 (three) times daily as needed for anxiety.  Indication:  Anxiety Neurosis   mirtazapine 7.5 MG tablet Commonly known as:  REMERON Take 1 tablet (7.5 mg total) by mouth at bedtime.  Indication:  Major Depressive Disorder   nicotine 21 mg/24hr patch Commonly known as:  NICODERM CQ - dosed in mg/24 hours Place 1 patch (21 mg total) onto the skin daily. Start taking on:  06/22/2016  Indication:  Nicotine Addiction      Follow-up Information    [facility] Follow up.   Specialty:  Professional Counselor Why:  Initial appointment will be walk in. Walk in hours are Monday through Friday between 8:00am and  12:00pm. Please take your photo ID, social security card and any insurance information.  Contact information: [facility] [place] [address] [place] [address] [phone]           Follow-up recommendations:  Activity:  as tol Diet:  as tol  Comments:  1.  Take all your medications as prescribed.   2.  Report any adverse side effects to outpatient provider. 3.  Patient instructed to not use alcohol or illegal drugs while on prescription medicines. 4.  In the event of worsening symptoms, instructed patient to call 911, the crisis hotline or go to nearest emergency room for evaluation of symptoms.  Signed: Lindwood Qua, NP [facility] 06/21/2016, 11:30  AM   Patient seen, Suicide Assessment Completed.  Disposition Plan Reviewed

## 2016-06-21 NOTE — Progress Notes (Signed)
  [facility] Discharge Plan :  Will you be returning to the same living situation after discharge:  Yes,  pt returning home. At discharge, do you have transportation home?: Yes,  pt has access to transportation. Do you have the ability to pay for your medications: Yes,  prescriptions and samples provided.  Release of information consent forms completed and in the chart;  Patient's signature needed at discharge.  Patient to Follow up at: Follow-up [facility]    [facility] Follow up.   Specialty:  Professional Counselor Why:  Initial appointment will be walk in. Walk in hours are Monday through Friday between 8:00am and 12:00pm. Please take your photo ID, social security card and any insurance information.  Contact information: [facility] [address] [place] [address] [phone]           Next level of care provider has access to [facility] Link:no  Safety Planning and Suicide Prevention discussed: Yes,  with pt.  Have you used any form of tobacco in the last 30 days? (Cigarettes, Smokeless Tobacco, Cigars, and/or Pipes): Yes  Has patient been referred to the Quitline?: Patient refused referral  Patient has been referred for addiction treatment: Yes  Jonathon JordanLynn B Angla Delahunt 06/21/2016, 1:12 PM

## 2016-06-21 NOTE — [facility] Suicide Risk Assessment (Signed)
[facility] Discharge Suicide Risk Assessment   Principal Problem: Severe recurrent major depression without psychotic features [facility]) Discharge Diagnoses:  Patient Active Problem List   Diagnosis Date Noted  . Severe recurrent major depression without psychotic features (HCC) [F33.2] 06/17/2016    Total Time spent with patient: 30 minutes  Musculoskeletal: Strength & Muscle Tone: within normal limits Gait & Station: normal Patient leans: N/A  Psychiatric Specialty Exam: ROS denies headache, no chest pain, no shortness of breath, no rash, no fever, no chills, denies increased pain on R hand  ( preserved capillary filling , preserved distal digit mobility)     Blood pressure (!) 147/94, pulse 62, temperature 97.9 F (36.6 C), temperature source Oral, resp. rate 20, height 5\' 10"  (1.778 m), weight 64.9 kg (143 lb).Body mass index is 20.52 kg/m.  General Appearance: Fairly Groomed  Patent attorney::  Good  Speech:  Normal Rate409  Volume:  Normal  Mood:  "OK", denies feeling depressed   Affect:  Full Range  Thought Process:  Linear  Orientation:  Full (Time, Place, and Person)  Thought Content:  denies hallucinations, no delusions, not internally preoccupied   Suicidal Thoughts:  No denies any suicidal or self injurious ideations   Homicidal Thoughts:  No denies any homicidal or violent ideations, specifically also denies any thoughts of violence towards GF , states " I would never hurt her ".  Memory:  recent and remote grossly intact   Judgement:  Fair- improving   Insight:  Fair- improving   Psychomotor Activity:  Normal  Concentration:  Good  Recall:  Good  Fund of Knowledge:Good  Language: Good  Akathisia:  Negative  Handed:  Right  AIMS (if indicated):     Assets:  Desire for Improvement Resilience  Sleep:  Number of Hours: 6.25  Cognition: WNL  ADL's:  Intact   Mental Status Per Nursing Assessment::   On Admission:  Self-harm behaviors  Demographic Factors:  26 year old  male   Loss Factors: Relationship stressors   Historical Factors: No prior psychiatric admissions, denies prior self injurious behaviors   Risk Reduction Factors:   Positive coping skills or problem solving skills  Continued Clinical Symptoms:  Patient states he is feeling better. At this time presents alert, attentive, and denies feeling depressed, states mood is "OK", affect is appropriate , reactive, vaguely anxious . No thought disorder, no suicidal or self injurious ideations, no homicidal or violent ideations .  Future oriented - planning on going to [place] , [place] to visit family in the relatively near future . Denies medication side effects- tolerated Remeron well . Side effects reviewed . Patient states he plans to go live with his family at this time.   Cognitive Features That Contribute To Risk:  No gross cognitive deficits noted upon discharge. Is alert , attentive, and oriented x 3   Suicide Risk:  Mild:  Suicidal ideation of limited frequency, intensity, duration, and specificity.  There are no identifiable plans, no associated intent, mild dysphoria and related symptoms, good self-control (both objective and subjective assessment), few other risk factors, and identifiable protective factors, including available and accessible social support.  Follow-up [facility] Follow up.   Specialty:  Professional Counselor Why:  Initial appointment will be walk in. Walk in hours are Monday through Friday between 8:00am and 12:00pm. Please take your photo ID, social security card and any insurance information.  Contact information: [facility] [place] [address] [place] [address] [phone]           Plan Of Care/Follow-up recommendations:  Activity:  as tolerated  Diet:  Regular Tests:  NA Other:  See below Patient states he feels much better, ready for discharge- no current grounds for ongoing  involuntary commitment . He is leaving unit in good spirits  Plans to live with family Follow up as above . Plans to follow up with MD or at Urgent Care for ongoing monitoring of R hand fracture Craige CottaFernando A Cobos, MD 06/21/2016, 12:35 PM

## 2017-01-28 ENCOUNTER — Emergency Department (HOSPITAL_COMMUNITY): Payer: Self-pay

## 2017-01-28 ENCOUNTER — Other Ambulatory Visit: Payer: Self-pay

## 2017-01-28 ENCOUNTER — Emergency Department (HOSPITAL_COMMUNITY)
Admission: EM | Admit: 2017-01-28 | Discharge: 2017-01-28 | Disposition: A | Discharge status: Home / Self Care | Payer: Self-pay | Attending: Emergency Medicine | Admitting: Emergency Medicine

## 2017-01-28 ENCOUNTER — Encounter (HOSPITAL_COMMUNITY): Payer: Self-pay | Admitting: Emergency Medicine

## 2017-01-28 DIAGNOSIS — F17210 Nicotine dependence, cigarettes, uncomplicated: Secondary | ICD-10-CM | POA: Insufficient documentation

## 2017-01-28 DIAGNOSIS — Z79811 Long term (current) use of aromatase inhibitors: Secondary | ICD-10-CM | POA: Insufficient documentation

## 2017-01-28 DIAGNOSIS — Z79899 Other long term (current) drug therapy: Secondary | ICD-10-CM | POA: Insufficient documentation

## 2017-01-28 DIAGNOSIS — J4521 Mild intermittent asthma with (acute) exacerbation: Secondary | ICD-10-CM | POA: Insufficient documentation

## 2017-01-28 LAB — POCT I-STAT TROPONIN I: TROPONIN I, POC: 0 ng/mL (ref 0.00–0.08)

## 2017-01-28 LAB — BASIC METABOLIC PANEL
Anion gap: 9 (ref 5–15)
BUN: 25 mg/dL — ABNORMAL HIGH (ref 6–20)
CALCIUM: 9.7 mg/dL (ref 8.9–10.3)
CO2: 25 mmol/L (ref 22–32)
CREATININE: 1.32 mg/dL — AB (ref 0.61–1.24)
Chloride: 104 mmol/L (ref 101–111)
GFR calc non Af Amer: >60 mL/min (ref >60)
Glucose, Bld: 93 mg/dL (ref 65–99)
Potassium: 4 mmol/L (ref 3.5–5.1)
SODIUM: 138 mmol/L (ref 135–145)

## 2017-01-28 LAB — CBC
HCT: 39.1 % (ref 39.0–52.0)
Hemoglobin: 13.5 g/dL (ref 13.0–17.0)
MCH: 31.9 pg (ref 26.0–34.0)
MCHC: 34.5 g/dL (ref 30.0–36.0)
MCV: 92.4 fL (ref 78.0–100.0)
PLATELETS: 310 10*3/uL (ref 150–400)
RBC: 4.23 MIL/uL (ref 4.22–5.81)
RDW: 11.5 % (ref 11.5–15.5)
WBC: 8.5 10*3/uL (ref 4.0–10.5)

## 2017-01-28 MED ORDER — ALBUTEROL SULFATE HFA 108 (90 BASE) MCG/ACT IN AERS
2.0000 | INHALATION_SPRAY | Freq: Once | RESPIRATORY_TRACT | Status: AC
  Administered 2017-01-28: 2 via RESPIRATORY_TRACT
  Filled 2017-01-28: qty 6.7

## 2017-01-28 MED ORDER — ALBUTEROL SULFATE (2.5 MG/3ML) 0.083% IN NEBU
5.0000 mg | INHALATION_SOLUTION | Freq: Once | RESPIRATORY_TRACT | Status: AC
  Administered 2017-01-28: 5 mg via RESPIRATORY_TRACT
  Filled 2017-01-28: qty 6

## 2017-01-28 MED ORDER — IPRATROPIUM BROMIDE 0.02 % IN SOLN
0.5000 mg | Freq: Once | RESPIRATORY_TRACT | Status: AC
  Administered 2017-01-28: 0.5 mg via RESPIRATORY_TRACT
  Filled 2017-01-28: qty 2.5

## 2017-01-28 MED ORDER — PREDNISONE 20 MG PO TABS: ORAL_TABLET | ORAL | 0 refills | Status: AC

## 2017-01-28 MED ORDER — OXYMETAZOLINE HCL 0.05 % NA SOLN
1.0000 | Freq: Once | NASAL | Status: AC
  Administered 2017-01-28: 1 via NASAL
  Filled 2017-01-28: qty 15

## 2017-01-28 MED ORDER — PREDNISONE 20 MG PO TABS
60.0000 mg | ORAL_TABLET | Freq: Once | ORAL | Status: AC
  Administered 2017-01-28: 60 mg via ORAL
  Filled 2017-01-28: qty 3

## 2017-01-28 NOTE — ED Triage Notes (Signed)
Patient complaining of chest pain and difficulty breathing. Patient has a hx of asthma. Patient also complaining of left chest pain. Patient states it started 01/27/2017 @ 11:50 pm.

## 2017-01-28 NOTE — ED Provider Notes (Signed)
[facility]-EMERGENCY DEPT Provider Note   CSN: [id number] Arrival date & time: 01/28/17  0348     History   Chief Complaint Chief Complaint  Patient presents with  . Chest Pain  . Shortness of Breath    HPI Raymond [name] is a 26 y.o. male history of asthma here presenting with cough, shortness of breath, congestion.  He states that he got off of work around 1 AM and had trouble breathing so came into the ER.  He did wake up earlier today and had some congestion and some shortness of breath.  He states that this is similar to his previous asthma exacerbation.  He has some chills but denies any actual fevers.  Denies any chest pain or leg swelling or calf pain.  Has history of asthma and was treated for asthma exacerbation about a year ago.   The history is provided by the patient.    Past Medical History:  Diagnosis Date  . Asthma     Patient Active Problem List   Diagnosis Date Noted  . 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 No pertinent surgical history.     Home Medications    Prior to Admission medications   Medication Sig Start Date End Date Taking? Authorizing Provider  hydrOXYzine (ATARAX/VISTARIL) 25 MG tablet Take 1 tablet (25 mg total) by mouth 3 (three) times daily as needed for anxiety. 06/21/16   Adonis Brook, NP  mirtazapine (REMERON) 7.5 MG tablet Take 1 tablet (7.5 mg total) by mouth at bedtime. 06/21/16   Adonis Brook, NP  nicotine (NICODERM CQ - DOSED IN MG/24 HOURS) 21 mg/24hr patch Place 1 patch (21 mg total) onto the skin daily. 06/22/16   Adonis Brook, NP    Family History History reviewed. No pertinent family history.  Social History Social History  Substance Use Topics  . Smoking status: Current Every Day Smoker    Packs/day: 0.50  . Smokeless tobacco: Never Used  . Alcohol use No     Allergies   Sulfa antibiotics   Review of  Systems Review of Systems  Respiratory: Positive for cough and shortness of breath.   All other systems reviewed and are negative.    Physical Exam Updated Vital Signs Pulse 71   Temp 98.3 F (36.8 C) (Oral)   Resp 16   Ht 5\' 10"  (1.778 m)   Wt 72.6 kg (160 lb)   SpO2 97%   BMI 22.96 kg/m   Physical Exam  Constitutional: He is oriented to person, place, and time. He appears well-developed and well-nourished.  HENT:  Head: Normocephalic.  Mouth/Throat: Oropharynx is clear and moist.  Nasal congestion   Eyes: Pupils are equal, round, and reactive to light. Conjunctivae and EOM are normal.  Neck: Normal range of motion. Neck supple.  Cardiovascular: Normal rate, regular rhythm and normal heart sounds.   Pulmonary/Chest:  Slightly tachypneic, mild diffuse wheezing, no retractions   Abdominal: Soft. Bowel sounds are normal.  Musculoskeletal: Normal range of motion. He exhibits no edema, tenderness or deformity.  Neurological: He is alert and oriented to person, place, and time. No cranial nerve deficit. Coordination normal.  Skin: Skin is warm.  Psychiatric: He has a normal mood and affect.  Nursing note and vitals reviewed.    ED Treatments / Results  Labs (all labs ordered are listed, but only abnormal results are displayed) Labs Reviewed  BASIC METABOLIC PANEL - Abnormal; Notable for the following:  Result Value   BUN 25 (*)    Creatinine, Ser 1.32 (*)    All other components within normal limits  CBC  I-STAT TROPONIN, ED  POCT I-STAT TROPONIN I    EKG  EKG Interpretation  Date/Time:  Saturday January 28 2017 04:30:13 EDT Ventricular Rate:  80 PR Interval:    QRS Duration: 90 QT Interval:  348 QTC Calculation: 402 R Axis:   75 Text Interpretation:  Sinus rhythm J point elevation  unchanged from earlier in the day  Confirmed by Richardean CanalYao, Tikia Skilton H ([id number]) on 01/28/2017 4:53:32 AM       Radiology Dg Chest 2 View  Result Date: 01/28/2017 CLINICAL DATA:   Chest pain for 1 day.  Cough. EXAM: CHEST  2 VIEW COMPARISON:  None. FINDINGS: The cardiomediastinal contours are normal. Borderline hyperinflation. Pulmonary vasculature is normal. No consolidation, pleural effusion, or pneumothorax. No acute osseous abnormalities are seen. IMPRESSION: Borderline hyperinflation can be seen with asthma, bronchitis, or smoking related lung disease. Electronically Signed   By: Rubye OaksMelanie  Ehinger M.D.   On: 01/28/2017 04:33    Procedures Procedures (including critical care time)  Medications Ordered in ED Medications  albuterol (PROVENTIL HFA;VENTOLIN HFA) 108 (90 Base) MCG/ACT inhaler 2 puff (not administered)  albuterol (PROVENTIL) (2.5 MG/3ML) 0.083% nebulizer solution 5 mg (5 mg Nebulization Given 01/28/17 0504)  ipratropium (ATROVENT) nebulizer solution 0.5 mg (0.5 mg Nebulization Given 01/28/17 0504)  oxymetazoline (AFRIN) 0.05 % nasal spray 1 spray (1 spray Each Nare Given 01/28/17 0636)  predniSONE (DELTASONE) tablet 60 mg (60 mg Oral Given 01/28/17 0636)     Initial Impression / Assessment and Plan / ED Course  I have reviewed the triage vital signs and the nursing notes.  Pertinent labs & imaging results that were available during my care of the patient were reviewed by me and considered in my medical decision making (see chart for details).     Vilinda Flakeathaniel Coviello is a 26 y.o. male here with cough, shortness of breath. Likely asthma exacerbation. Nonspecific EKG changes, likely J point elevation. Denies pleuritic chest pain and no fevers in the ED. No hx of CAD. Will get labs, trop x 1, CXR. Will give nebs, steroids for asthma exacerbation.   6:57 AM Labs unremarkable. Trop neg x 1. CXR showed asthma. Minimal wheezing after 1 neb, improved air movement now. Will dc home with albuterol, prednisone.   Final Clinical Impressions(s) / ED Diagnoses   Final diagnoses:  None    New Prescriptions New Prescriptions   No medications on file     Charlynne PanderYao,  Mylik Pro Hsienta, MD 01/28/17 [phone]

## 2017-01-28 NOTE — Discharge Instructions (Signed)
Take prednisone as prescribed.   Use albuterol every 4 hrs as needed for cough and wheezing.   See your doctor  Return to ER if you have worse chest pain, trouble breathing, fever, cough.

## 2017-06-21 ENCOUNTER — Emergency Department (HOSPITAL_COMMUNITY)
Admission: EM | Admit: 2017-06-21 | Discharge: 2017-06-21 | Disposition: A | Discharge status: Home / Self Care | Payer: Self-pay | Attending: Emergency Medicine | Admitting: Emergency Medicine

## 2017-06-21 ENCOUNTER — Encounter (HOSPITAL_COMMUNITY): Payer: Self-pay

## 2017-06-21 DIAGNOSIS — Z5321 Procedure and treatment not carried out due to patient leaving prior to being seen by health care provider: Secondary | ICD-10-CM | POA: Insufficient documentation

## 2017-06-21 DIAGNOSIS — K0889 Other specified disorders of teeth and supporting structures: Secondary | ICD-10-CM | POA: Insufficient documentation

## 2017-06-21 NOTE — ED Triage Notes (Signed)
Patient complains of pain due to wisdom teeth coming through, reports some bleeding from all 4 sites, NAD

## 2017-06-21 NOTE — ED Notes (Signed)
Pt states he has to leave to go to work, will return tonight after work to be seen. Pt seen leaving ED with steady gait.

## 2017-06-22 ENCOUNTER — Emergency Department (HOSPITAL_COMMUNITY)
Admission: EM | Admit: 2017-06-22 | Discharge: 2017-06-22 | Disposition: A | Discharge status: Home / Self Care | Payer: Managed Care, Other (non HMO) | Attending: Emergency Medicine | Admitting: Emergency Medicine

## 2017-06-22 ENCOUNTER — Encounter (HOSPITAL_COMMUNITY): Payer: Self-pay

## 2017-06-22 DIAGNOSIS — F17210 Nicotine dependence, cigarettes, uncomplicated: Secondary | ICD-10-CM | POA: Insufficient documentation

## 2017-06-22 DIAGNOSIS — Z760 Encounter for issue of repeat prescription: Secondary | ICD-10-CM | POA: Insufficient documentation

## 2017-06-22 DIAGNOSIS — K0889 Other specified disorders of teeth and supporting structures: Secondary | ICD-10-CM | POA: Diagnosis not present

## 2017-06-22 DIAGNOSIS — J45909 Unspecified asthma, uncomplicated: Secondary | ICD-10-CM | POA: Diagnosis not present

## 2017-06-22 DIAGNOSIS — Z79899 Other long term (current) drug therapy: Secondary | ICD-10-CM | POA: Insufficient documentation

## 2017-06-22 MED ORDER — ALBUTEROL SULFATE HFA 108 (90 BASE) MCG/ACT IN AERS
2.0000 | INHALATION_SPRAY | RESPIRATORY_TRACT | Status: DC | PRN
  Filled 2017-06-22: qty 6.7

## 2017-06-22 MED ORDER — IBUPROFEN 600 MG PO TABS: 600.0000 mg | ORAL_TABLET | Freq: Four times a day (QID) | ORAL | 0 refills | Status: AC | PRN

## 2017-06-22 MED ORDER — IBUPROFEN 400 MG PO TABS
600.0000 mg | ORAL_TABLET | Freq: Once | ORAL | Status: AC
  Administered 2017-06-22: 09:00:00 600 mg via ORAL
  Filled 2017-06-22: qty 1

## 2017-06-22 NOTE — ED Triage Notes (Signed)
Pt states that he has had dental pain for a month on both sides from his wisdom teeth. Pt also requesting a new inhaler since he is out

## 2017-06-22 NOTE — ED Provider Notes (Signed)
[facility] EMERGENCY DEPARTMENT Provider Note   CSN: [id number] Arrival date & time: 06/22/17  0427     History   Chief Complaint Chief Complaint  Patient presents with  . Dental Pain    HPI Raymond [name] is a 27 y.o. male.  HPI   27 year old male resenting for evaluation of dental pain.  Furthermore, he also requesting for a new inhaler since he ran out.  History of asthma.  Patient mentioned that his wisdom teeth have been bothering him for the past 6 months.  States that it is growing crooked and causing a lot of pain.  Pain has been intermittent for the past 6 months as well worsening within the past several weeks.  Pain is sharp, with occasional bleeding gum when he bites on it.  He has been taking Tylenol with some improvement.  He is requesting for dental referral.  He denies any associated fever, throat swelling, or trouble swallowing.  Denies ear pain.  Patient also mentioned he ran out of his inhaler for the past 2-3 days.  He usually uses 2-3 times a week.  Denies any shortness of breath currently.  Past Medical History:  Diagnosis Date  . Asthma     Patient Active Problem List   Diagnosis Date Noted  . 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 No pertinent surgical history.      Home Medications    Prior to Admission medications   Medication Sig Start Date End Date Taking? Authorizing Provider  hydrOXYzine (ATARAX/VISTARIL) 25 MG tablet Take 1 tablet (25 mg total) by mouth 3 (three) times daily as needed for anxiety. 06/21/16   Adonis Brook, NP  mirtazapine (REMERON) 7.5 MG tablet Take 1 tablet (7.5 mg total) by mouth at bedtime. 06/21/16   Adonis Brook, NP  nicotine (NICODERM CQ - DOSED IN MG/24 HOURS) 21 mg/24hr patch Place 1 patch (21 mg total) onto the skin daily. 06/22/16   Adonis Brook, NP  predniSONE (DELTASONE) 20 MG tablet Take 60 mg daily x 2 days then 40 mg daily  x 2 days then 20 mg daily x 2 days 01/28/17   Charlynne Pander, MD    Family History No family history on file.  Social History Social History   Tobacco Use  . Smoking status: Current Every Day Smoker    Packs/day: 0.50  . Smokeless tobacco: Never Used  Substance Use Topics  . Alcohol use: No  . Drug use: Yes    Types: Marijuana     Allergies   Sulfa antibiotics   Review of Systems Review of Systems  All other systems reviewed and are negative.    Physical Exam Updated Vital Signs BP 128/63   Pulse 60   Temp 97.8 F (36.6 C) (Oral)   Resp 18   SpO2 99%   Physical Exam  Constitutional: He appears well-developed and well-nourished. No distress.  HENT:  Head: Atraumatic.  Mouth: Normal dentition, no evidence of dental decay or dental infection noted.  Mild tenderness to the palpation of bilateral upper and lower molars  Eyes: Conjunctivae are normal.  Neck: Neck supple.  Cardiovascular: Normal rate and regular rhythm.  Pulmonary/Chest: Effort normal and breath sounds normal. He has no wheezes.  Abdominal: Soft. There is no tenderness.  Neurological: He is alert.  Skin: No rash noted.  Psychiatric: He has a normal mood and affect.  Nursing note and vitals reviewed.    ED Treatments /  Results  Labs (all labs ordered are listed, but only abnormal results are displayed) Labs Reviewed - No data to display  EKG None  Radiology No results found.  Procedures Procedures (including critical care time)  Medications Ordered in ED Medications - No data to display   Initial Impression / Assessment and Plan / ED Course  I have reviewed the triage vital signs and the nursing notes.  Pertinent labs & imaging results that were available during my care of the patient were reviewed by me and considered in my medical decision making (see chart for details).     BP 128/63   Pulse 60   Temp 97.8 F (36.6 C) (Oral)   Resp 18   SpO2 99%    Final Clinical  Impressions(s) / ED Diagnoses   Final diagnoses:  Pain, dental  Encounter for medication refill    ED Discharge Orders        Ordered    ibuprofen (ADVIL,MOTRIN) 600 MG tablet  Every 6 hours PRN     06/22/17 0816     8:15 AM Patient here requesting for dental referral due to pain relating to his wisdom teeth.  Referral given.  He also request for an albuterol inhaler refill, medication ordered.  Patient provide a list of resources for additional help.  Ibuprofen as needed for pain.  No evidence of infection therefore no antibiotic indicated at this time.   Fayrene Helperran, Horald Birky, PA-C 06/22/17 [id number]    Arby BarrettePfeiffer, Marcy, MD 07/01/17 [phone]

## 2018-03-14 IMAGING — CR DG HAND COMPLETE 3+V*R*
3 series · 3 of 3 positions shown · non-contrast
Comparison: No prior .

CLINICAL DATA: Injury.  Hand pain.

EXAM:
RIGHT HAND - COMPLETE 3+ VIEW

[x hand pa right]
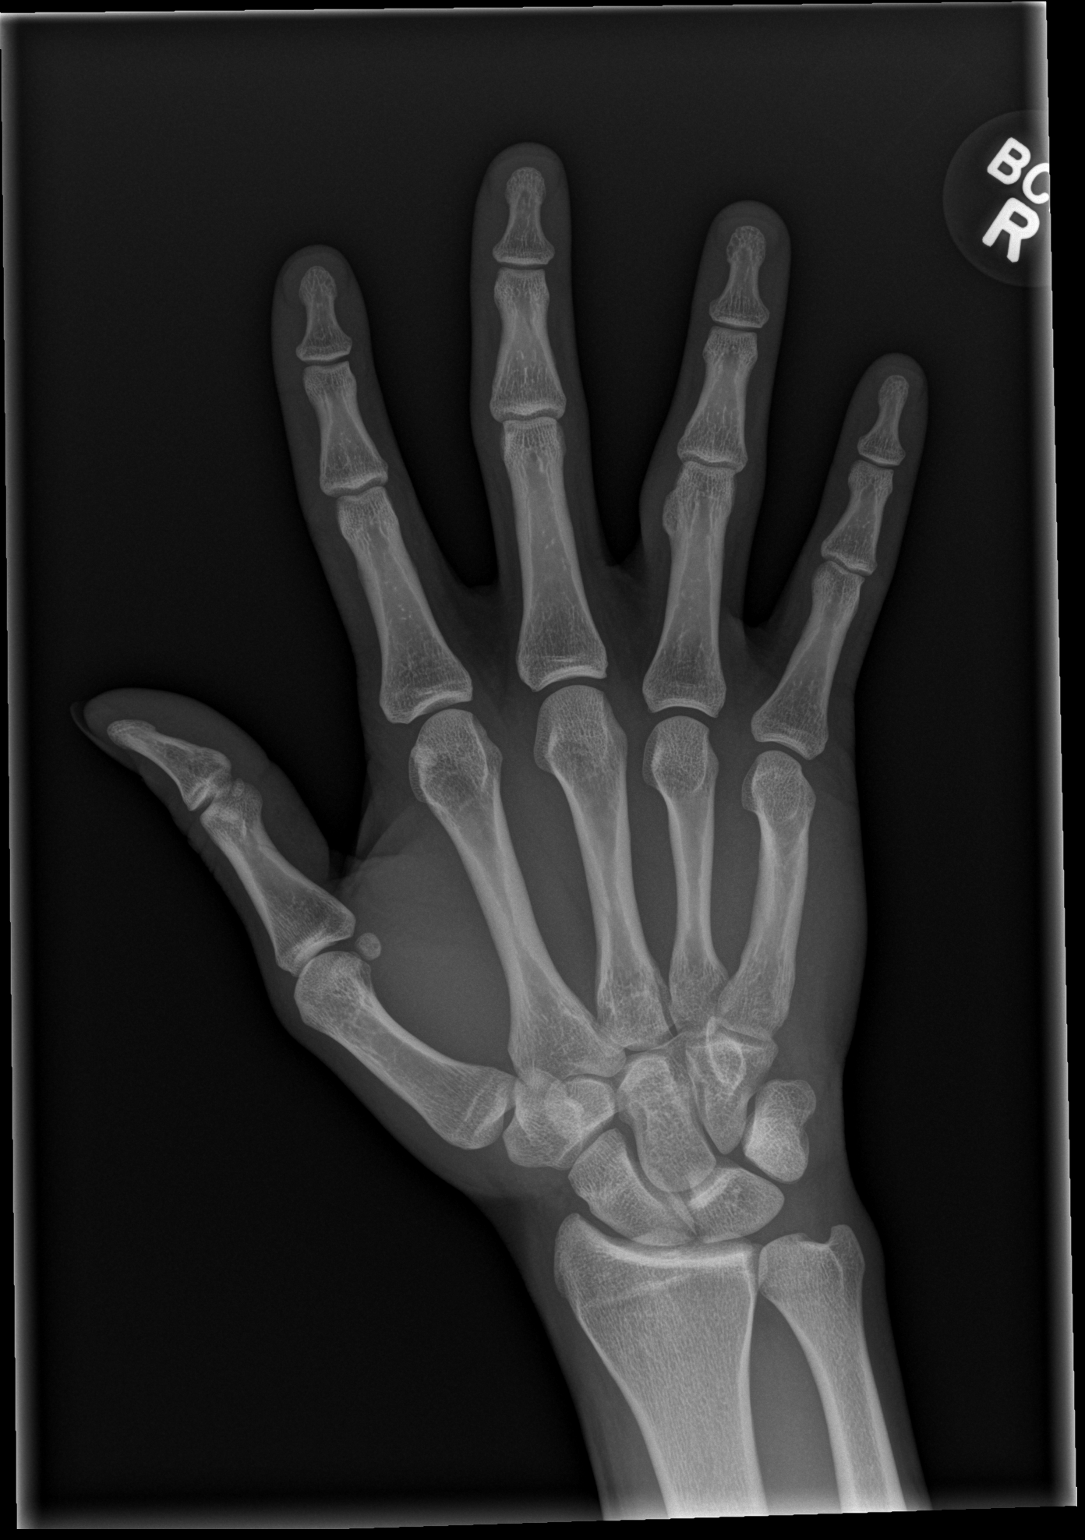

[x hand obl right]
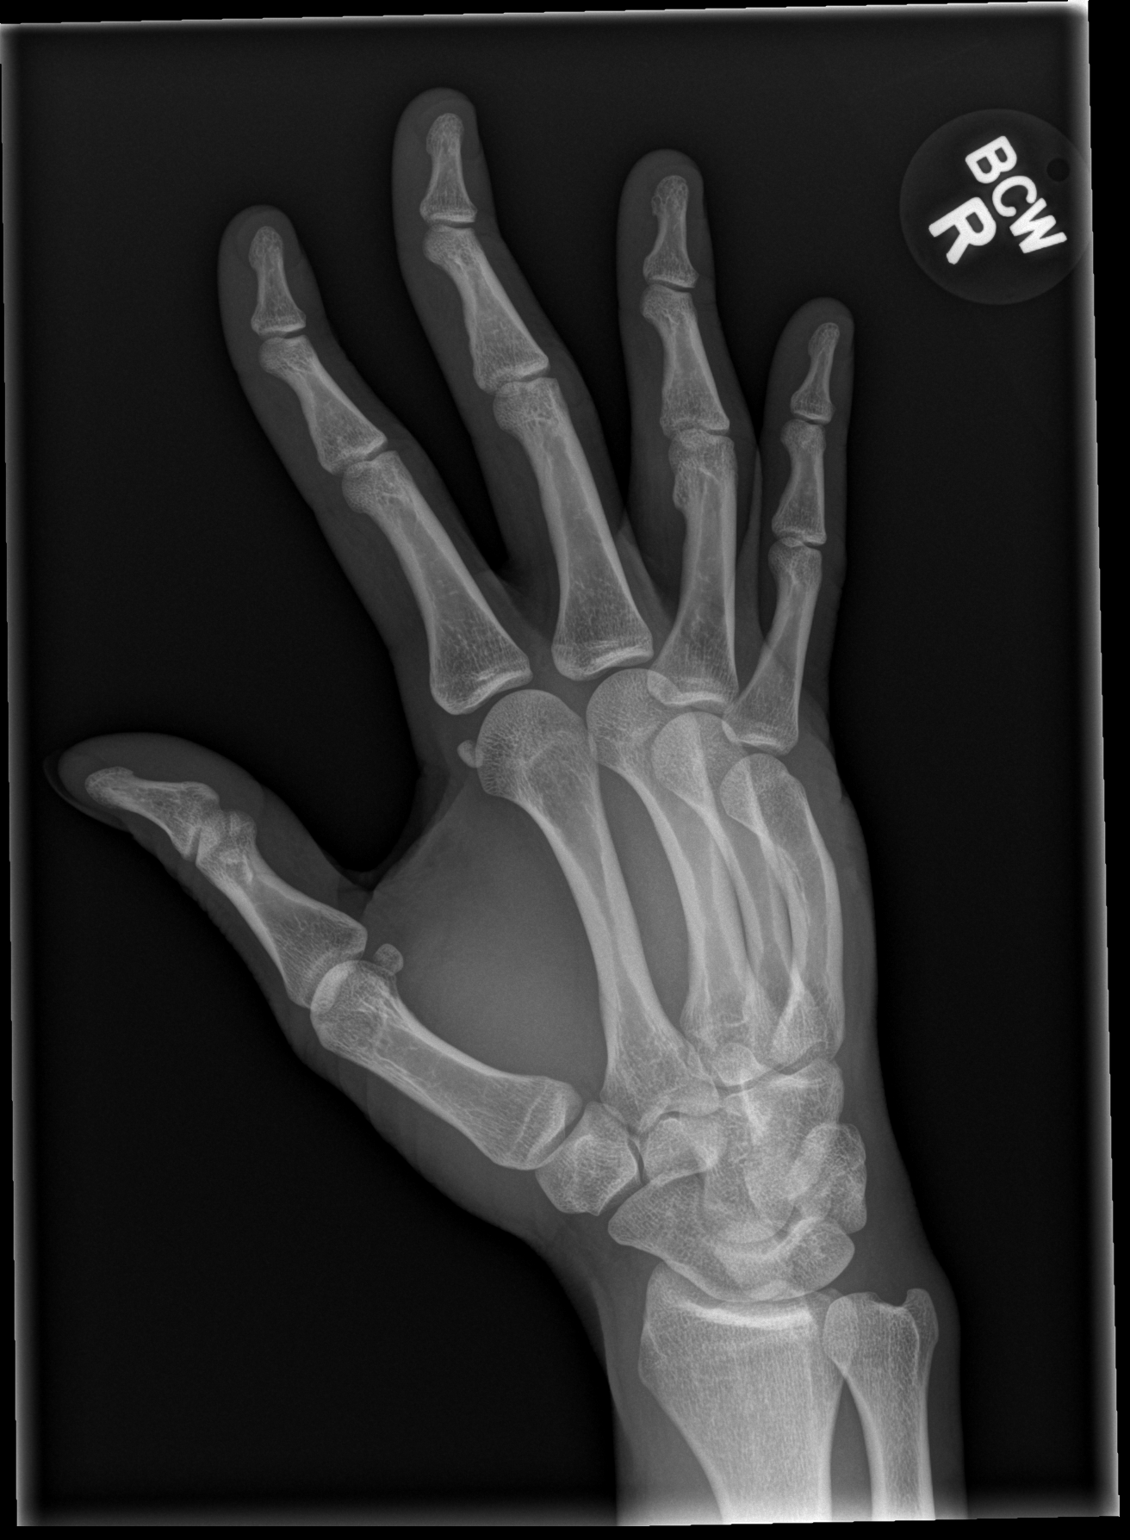

[x hand lat right]
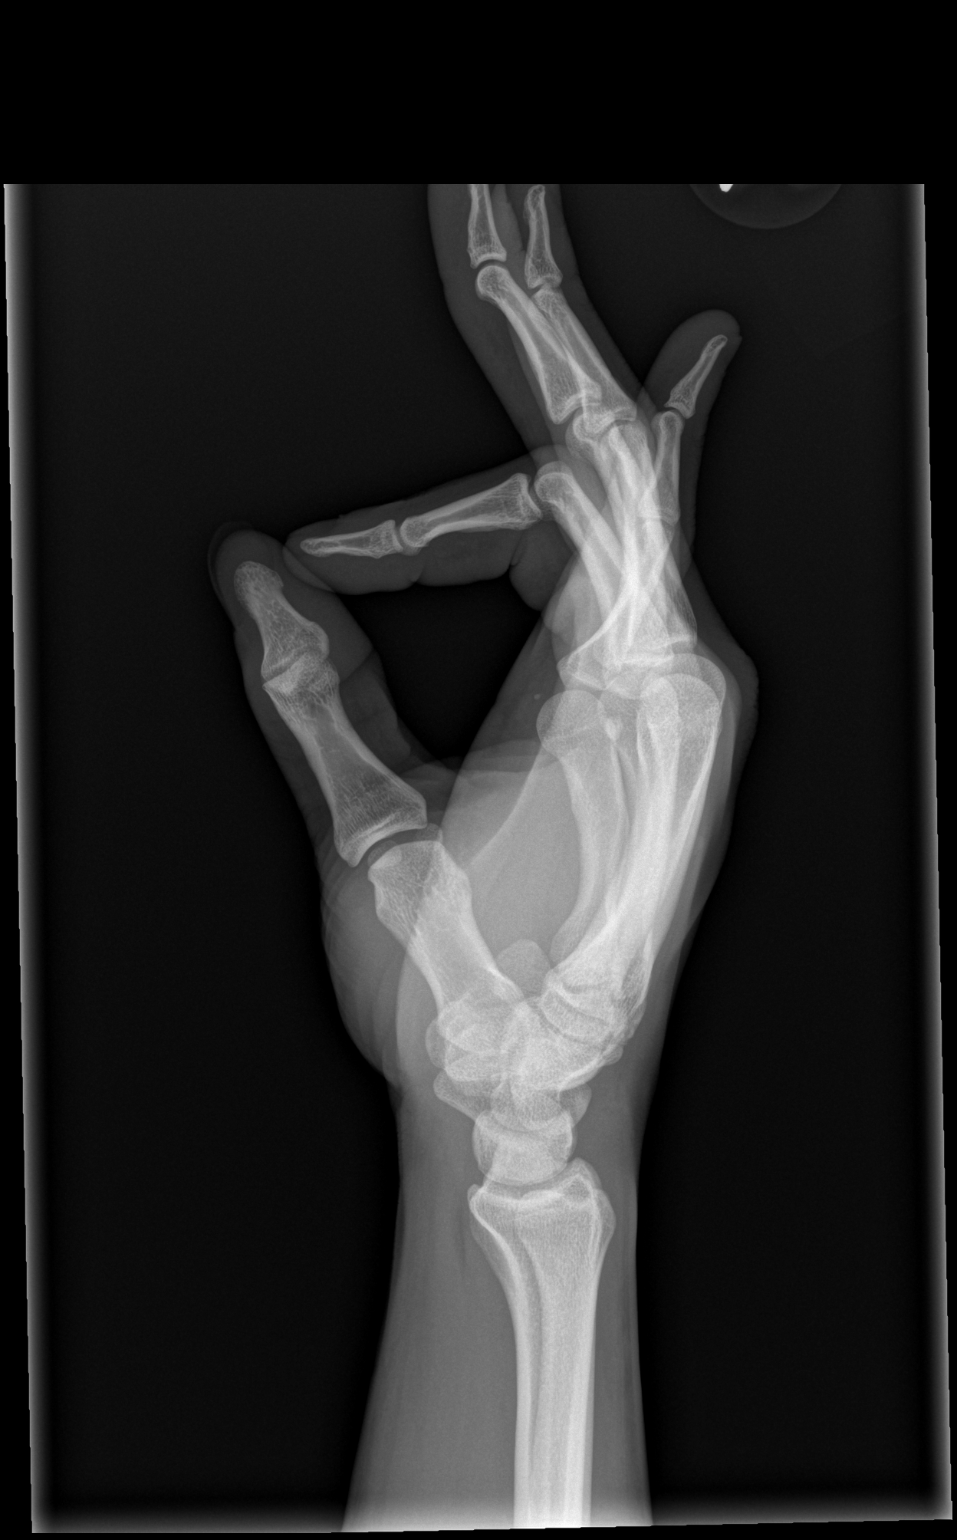

[3 of 3 positions shown; findings below may reference images not displayed]

FINDINGS: Deformity the right fifth metacarpal is noted. Although this may be
related to prior injury a questionable vertical linear lucency is
noted and fracture cannot be completely excluded . No other focal
abnormalities identified. No radiopaque foreign body .
IMPRESSION: Deformity of the right fifth metacarpal is noted. Although this may
be related to prior injury, a questionable cortical lucency is noted
and fracture cannot be completely excluded.

## 2018-04-14 ENCOUNTER — Encounter (HOSPITAL_COMMUNITY): Payer: Self-pay | Admitting: Emergency Medicine

## 2018-04-14 ENCOUNTER — Emergency Department (HOSPITAL_COMMUNITY)
Admission: EM | Admit: 2018-04-14 | Discharge: 2018-04-14 | Disposition: A | Discharge status: Home / Self Care | Payer: Managed Care, Other (non HMO) | Attending: Emergency Medicine | Admitting: Emergency Medicine

## 2018-04-14 DIAGNOSIS — Z5321 Procedure and treatment not carried out due to patient leaving prior to being seen by health care provider: Secondary | ICD-10-CM | POA: Insufficient documentation

## 2018-04-14 DIAGNOSIS — J45909 Unspecified asthma, uncomplicated: Secondary | ICD-10-CM | POA: Insufficient documentation

## 2018-04-14 MED ORDER — ALBUTEROL SULFATE (2.5 MG/3ML) 0.083% IN NEBU
5.0000 mg | INHALATION_SOLUTION | Freq: Once | RESPIRATORY_TRACT | Status: AC
  Administered 2018-04-14: 5 mg via RESPIRATORY_TRACT
  Filled 2018-04-14: qty 6

## 2018-04-14 NOTE — ED Triage Notes (Signed)
Found pt sleeping in triage.  Pt woken, asked for inhalers, was disappointed that he needed to wait to speak to provider, "I don't have time to wait, I have to catch a plane."  Lungs were clear, O2 level 100%.

## 2018-04-14 NOTE — ED Notes (Signed)
Pt called multiple times and not visible in lobby.

## 2018-04-14 NOTE — ED Notes (Signed)
No answer in waiting room 

## 2018-04-14 NOTE — ED Notes (Signed)
Pt called for vitals recheck x1 with no answer 

## 2018-10-25 IMAGING — CR DG CHEST 2V
2 series · 2 of 2 positions shown · non-contrast
Comparison: None.

CLINICAL DATA: Chest pain for 1 day.  Cough.

EXAM:
CHEST  2 VIEW

[w chest pa]
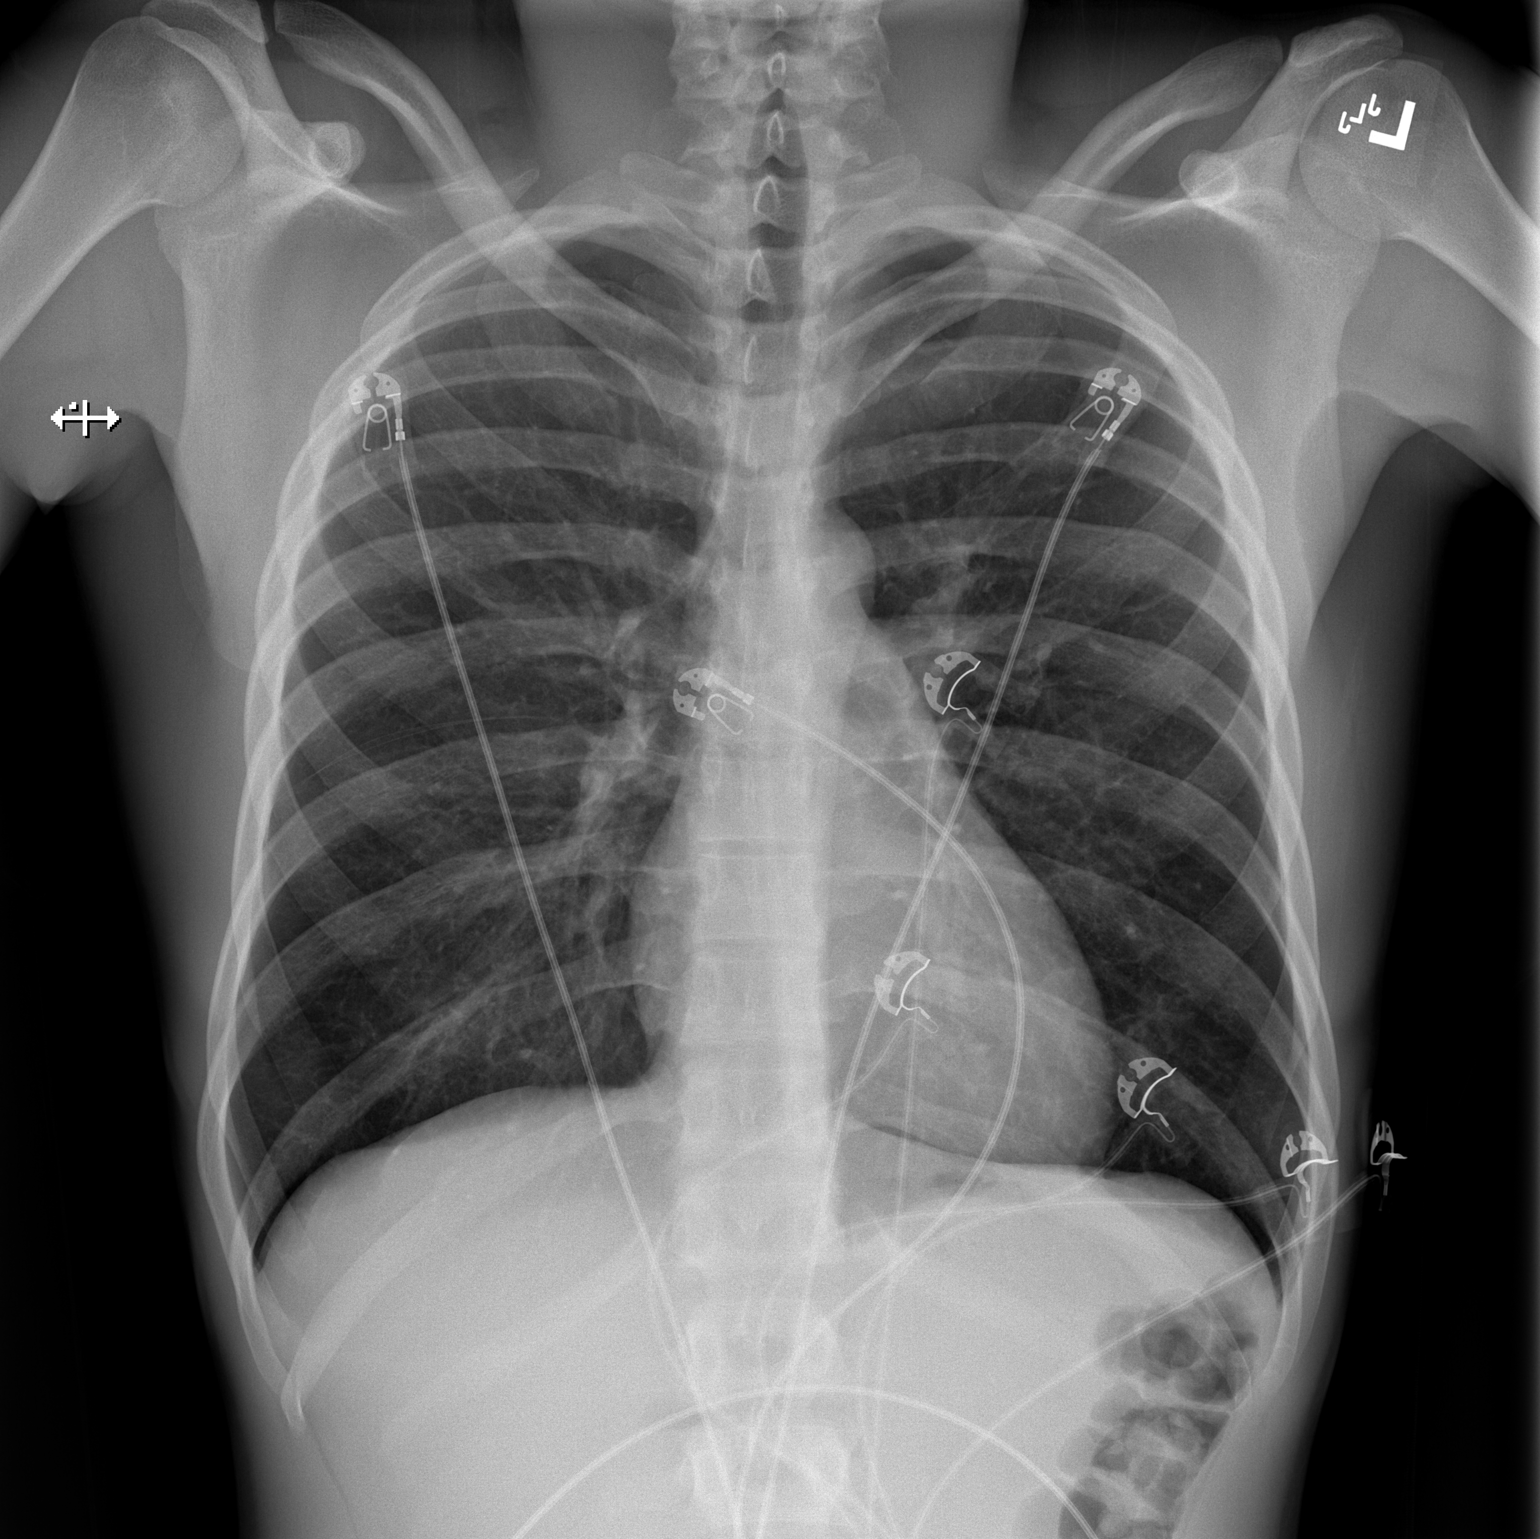

[w chest lat]
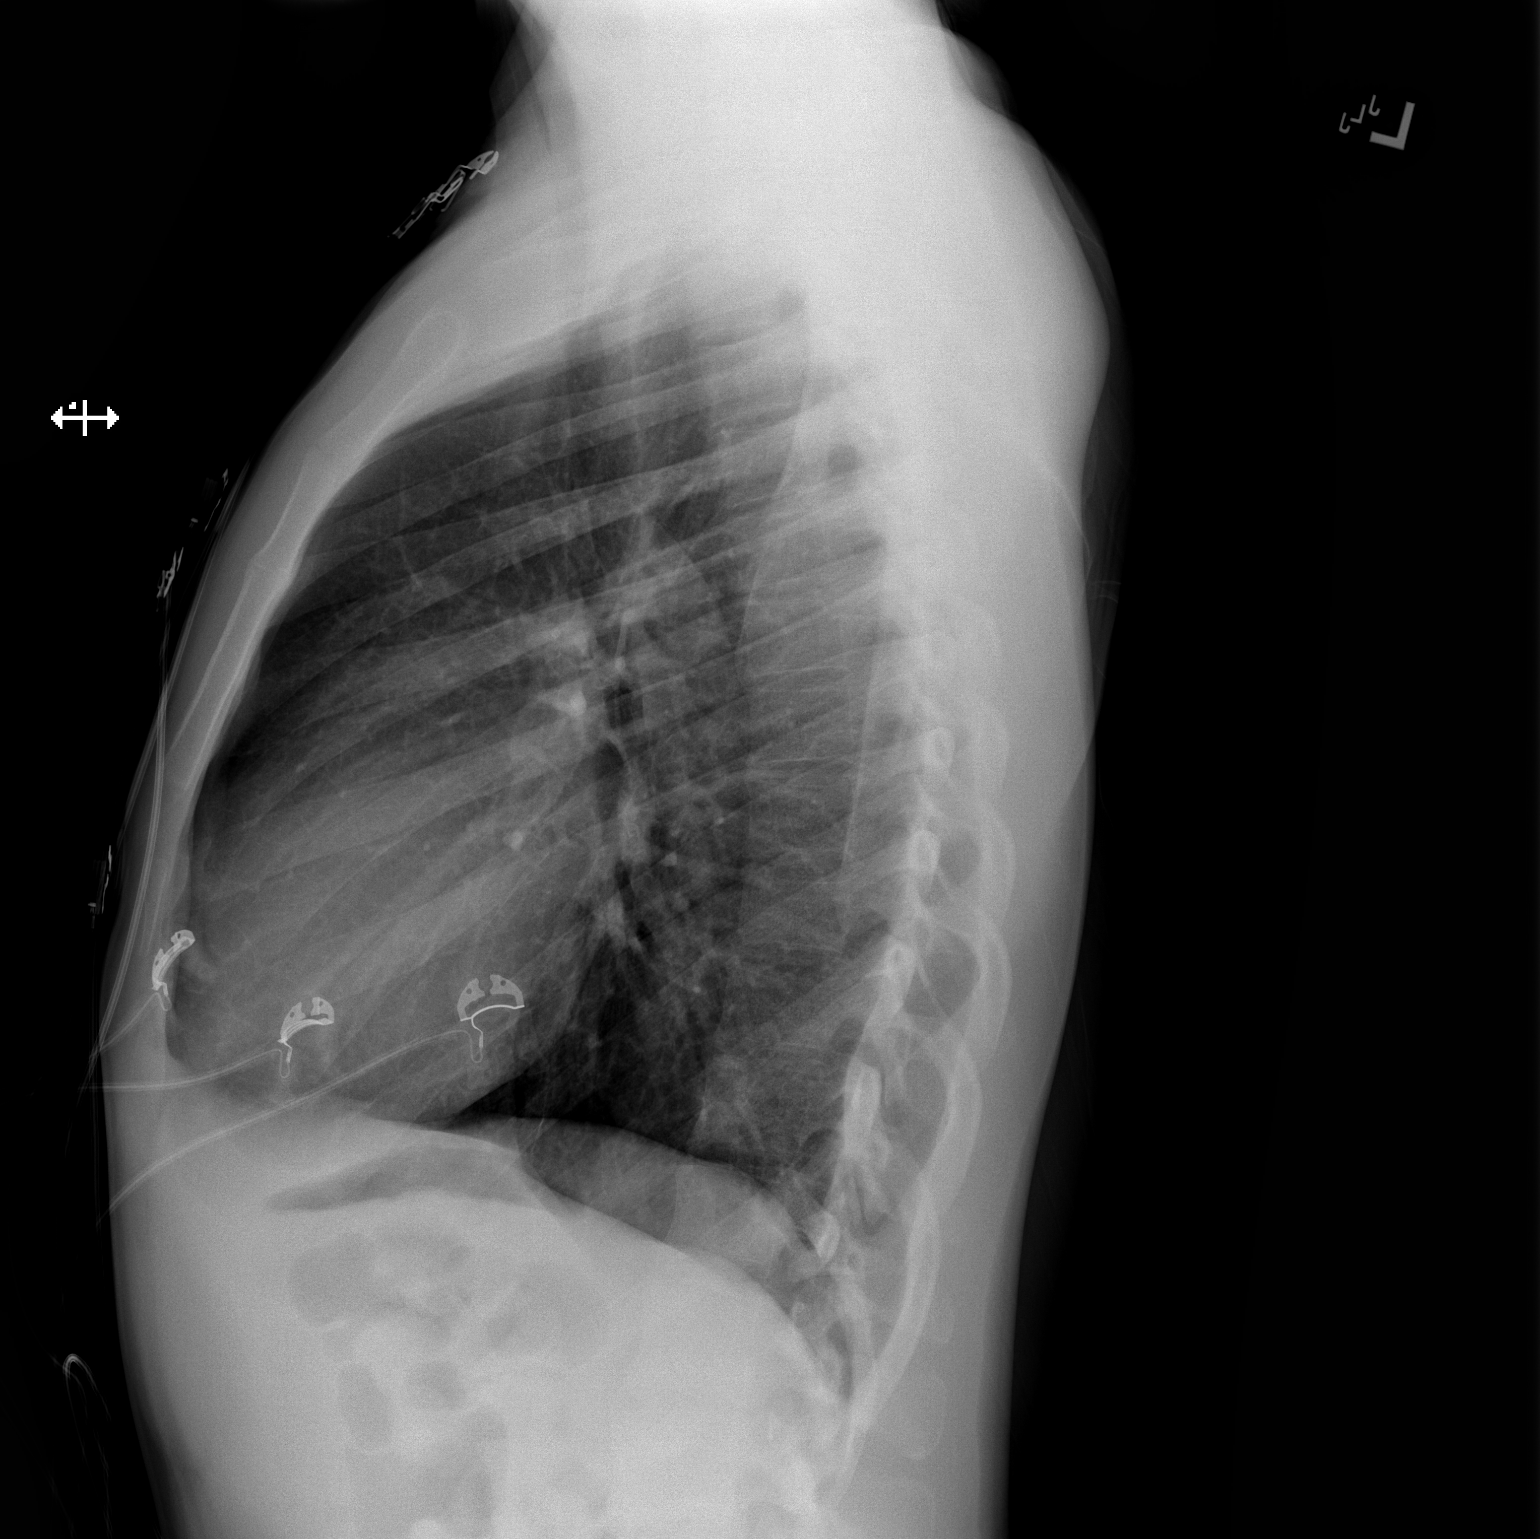

[2 of 2 positions shown; findings below may reference images not displayed]

FINDINGS: The cardiomediastinal contours are normal. Borderline
hyperinflation. Pulmonary vasculature is normal. No consolidation,
pleural effusion, or pneumothorax. No acute osseous abnormalities
are seen.
IMPRESSION: Borderline hyperinflation can be seen with asthma, bronchitis, or
smoking related lung disease.
